# Patient Record
Sex: Male | Born: 1960 | Race: Black or African American | Hispanic: No | Marital: Single | Smoking: Never smoker
Health system: Southern US, Community
[De-identification: ages and names within clinical notes are randomized; demographics above are authoritative.]

## PROBLEM LIST (undated history)

## (undated) DIAGNOSIS — E119 Type 2 diabetes mellitus without complications: Secondary | ICD-10-CM

## (undated) DIAGNOSIS — F0390 Unspecified dementia without behavioral disturbance: Secondary | ICD-10-CM

---

## 2003-08-26 ENCOUNTER — Inpatient Hospital Stay (HOSPITAL_COMMUNITY): Admission: EM | Admit: 2003-08-26 | Discharge: 2003-08-31 | Payer: Self-pay | Admitting: Pulmonary Disease

## 2005-12-05 IMAGING — CR DG CHEST 1V PORT
1 series · 1 of 1 positions shown · non-contrast
Comparison: none

CLINICAL DATA: Respiratory failure. 
 PORTABLE CHEST ([DATE] HOURS)

[view not recorded]
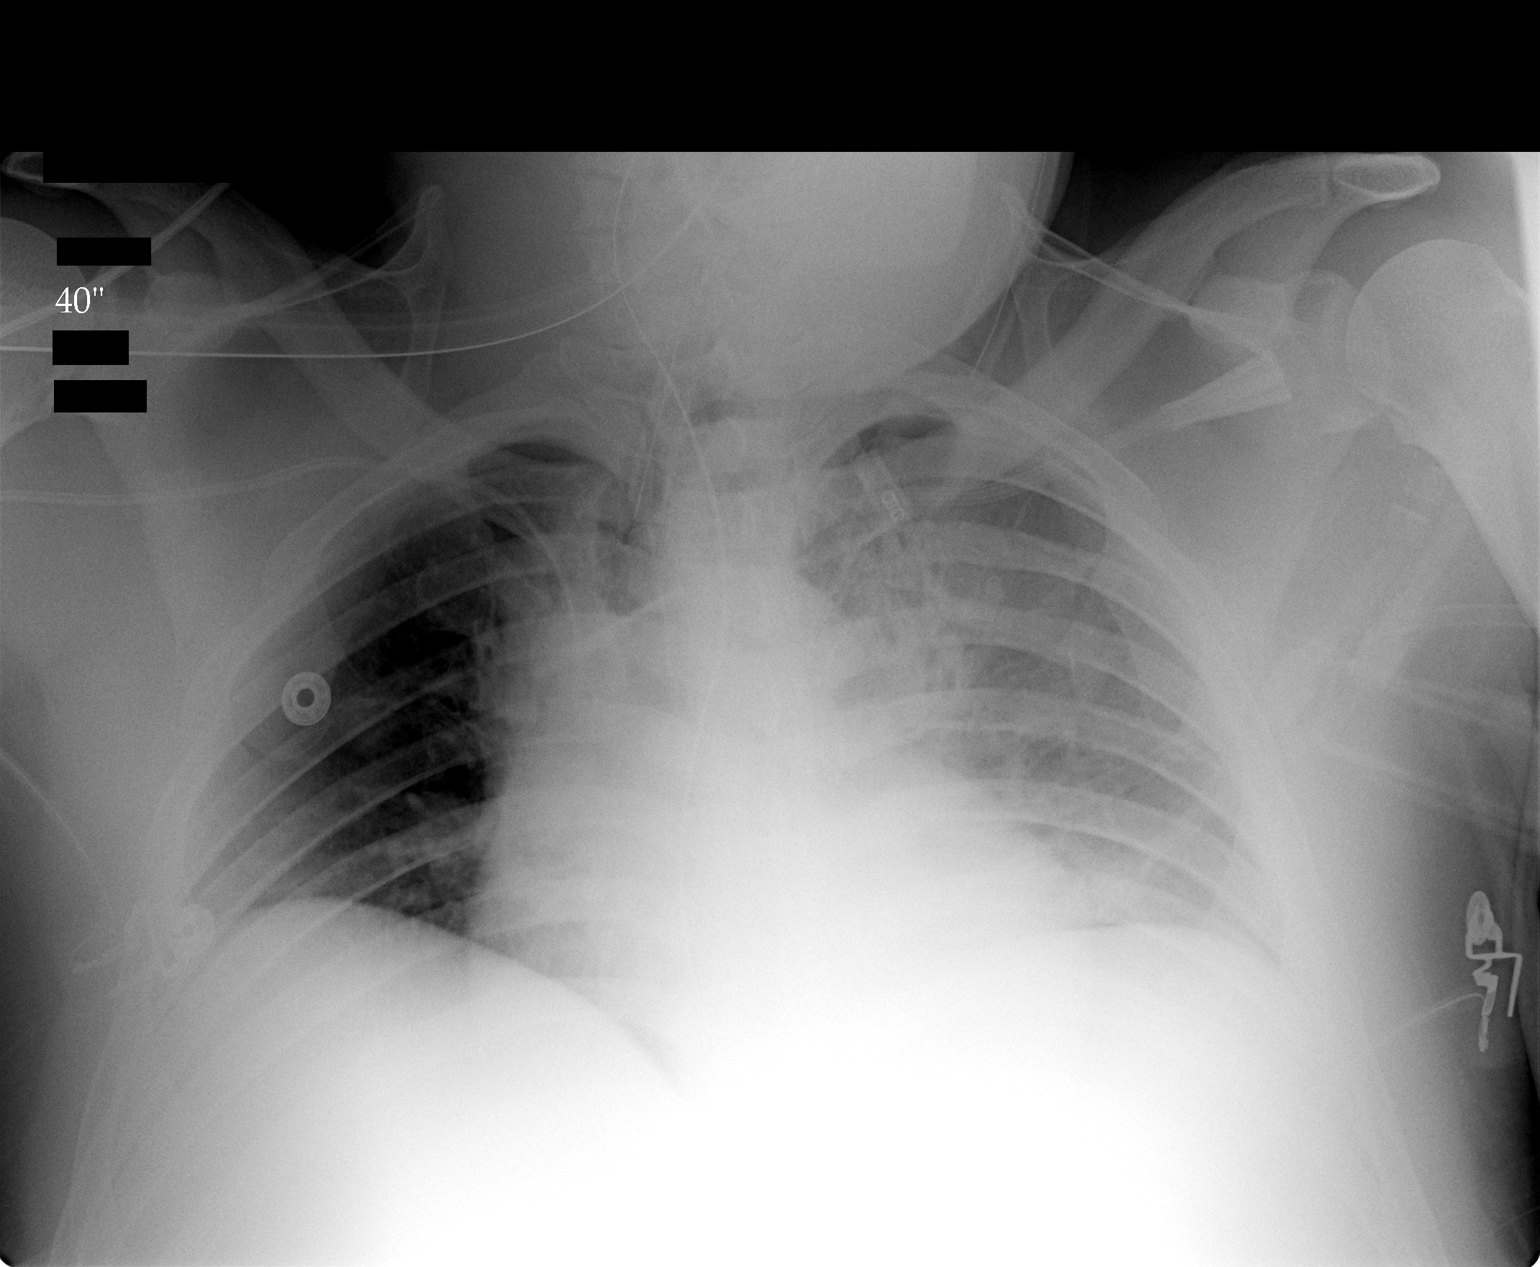

[1 of 1 positions shown; findings below may reference images not displayed]

FINDINGS: The endotracheal tube, bilateral central venous catheters and NG tube are stable. The lungs remain severely under inflated.  Atelectasis in the left upper lobe has worsened.  Atelectasis at the left base is stable.  The right lung is grossly clear.  No pneumothorax is seen.  The pulmonary vasculature is grossly within normal limits.
 IMPRESSION
 Worsening atelectasis in the left upper lobe.  Otherwise stable exam.

## 2011-10-09 DIAGNOSIS — E119 Type 2 diabetes mellitus without complications: Secondary | ICD-10-CM

## 2019-06-24 ENCOUNTER — Ambulatory Visit: Payer: Medicaid Other | Attending: Internal Medicine

## 2019-06-24 DIAGNOSIS — Z23 Encounter for immunization: Secondary | ICD-10-CM

## 2019-06-24 NOTE — Progress Notes (Signed)
   Covid-19 Vaccination Clinic  Name:  Jyron Turman    MRN: [id number] DOB: September 04, 1960  06/24/2019  Mr. Herbers was observed post Covid-19 immunization for 15 minutes without incident. He was provided with Vaccine Information Sheet and instruction to access the V-Safe system.   Mr. Quirino was instructed to call 911 with any severe reactions post vaccine: Marland Kitchen Difficulty breathing  . Swelling of face and throat  . A fast heartbeat  . A bad rash all over body  . Dizziness and weakness   Immunizations Administered    Name Date Dose VIS Date Route   Moderna COVID-19 Vaccine 06/24/2019  1:55 PM 0.5 mL 01/2019 Intramuscular   Manufacturer: Moderna   Lot: [id number]   NDC: 54360-677-03

## 2019-07-22 ENCOUNTER — Ambulatory Visit: Payer: Medicaid Other | Attending: Internal Medicine

## 2019-07-22 DIAGNOSIS — Z23 Encounter for immunization: Secondary | ICD-10-CM

## 2019-07-22 NOTE — Progress Notes (Signed)
   Covid-19 Vaccination Clinic  Name:  Curtis Tanner    MRN: [id number] DOB: 31-Dec-1960  07/22/2019  Curtis Tanner was observed post Covid-19 immunization for 15 minutes without incident. He was provided with Vaccine Information Sheet and instruction to access the V-Safe system.   Curtis Tanner was instructed to call 911 with any severe reactions post vaccine: Marland Kitchen Difficulty breathing  . Swelling of face and throat  . A fast heartbeat  . A bad rash all over body  . Dizziness and weakness   Immunizations Administered    Name Date Dose VIS Date Route   Moderna COVID-19 Vaccine 07/22/2019 10:06 AM 0.5 mL 01/2019 Intramuscular   Manufacturer: Moderna   Lot: [id number]   NDC: 02233-612-24

## 2020-12-30 ENCOUNTER — Emergency Department (HOSPITAL_COMMUNITY): Payer: Medicaid Other

## 2020-12-30 ENCOUNTER — Inpatient Hospital Stay (HOSPITAL_COMMUNITY)
Admission: EM | Admit: 2020-12-30 | Discharge: 2021-01-01 | DRG: 071 | Disposition: A | Discharge status: Home / Self Care | Payer: Medicaid Other | Attending: Internal Medicine | Admitting: Internal Medicine

## 2020-12-30 DIAGNOSIS — E119 Type 2 diabetes mellitus without complications: Secondary | ICD-10-CM

## 2020-12-30 DIAGNOSIS — E860 Dehydration: Secondary | ICD-10-CM | POA: Diagnosis present

## 2020-12-30 DIAGNOSIS — E876 Hypokalemia: Secondary | ICD-10-CM | POA: Diagnosis present

## 2020-12-30 DIAGNOSIS — I129 Hypertensive chronic kidney disease with stage 1 through stage 4 chronic kidney disease, or unspecified chronic kidney disease: Secondary | ICD-10-CM | POA: Diagnosis present

## 2020-12-30 DIAGNOSIS — G9340 Encephalopathy, unspecified: Principal | ICD-10-CM | POA: Diagnosis present

## 2020-12-30 DIAGNOSIS — Z888 Allergy status to other drugs, medicaments and biological substances status: Secondary | ICD-10-CM

## 2020-12-30 DIAGNOSIS — K219 Gastro-esophageal reflux disease without esophagitis: Secondary | ICD-10-CM

## 2020-12-30 DIAGNOSIS — I10 Essential (primary) hypertension: Secondary | ICD-10-CM

## 2020-12-30 DIAGNOSIS — N1831 Chronic kidney disease, stage 3a: Secondary | ICD-10-CM | POA: Diagnosis present

## 2020-12-30 DIAGNOSIS — R4182 Altered mental status, unspecified: Secondary | ICD-10-CM | POA: Diagnosis present

## 2020-12-30 DIAGNOSIS — E1122 Type 2 diabetes mellitus with diabetic chronic kidney disease: Secondary | ICD-10-CM | POA: Diagnosis present

## 2020-12-30 DIAGNOSIS — E1165 Type 2 diabetes mellitus with hyperglycemia: Secondary | ICD-10-CM | POA: Diagnosis present

## 2020-12-30 DIAGNOSIS — N179 Acute kidney failure, unspecified: Principal | ICD-10-CM

## 2020-12-30 DIAGNOSIS — Z91040 Latex allergy status: Secondary | ICD-10-CM

## 2020-12-30 DIAGNOSIS — Z20822 Contact with and (suspected) exposure to covid-19: Secondary | ICD-10-CM | POA: Diagnosis present

## 2020-12-30 DIAGNOSIS — A419 Sepsis, unspecified organism: Secondary | ICD-10-CM

## 2020-12-30 DIAGNOSIS — J45909 Unspecified asthma, uncomplicated: Secondary | ICD-10-CM | POA: Diagnosis present

## 2020-12-30 DIAGNOSIS — I959 Hypotension, unspecified: Secondary | ICD-10-CM | POA: Diagnosis present

## 2020-12-30 DIAGNOSIS — R001 Bradycardia, unspecified: Secondary | ICD-10-CM

## 2020-12-30 LAB — RAPID URINE DRUG SCREEN, HOSP PERFORMED
Amphetamines: NOT DETECTED
Barbiturates: NOT DETECTED
Benzodiazepines: NOT DETECTED
Cocaine: NOT DETECTED
Opiates: NOT DETECTED
Tetrahydrocannabinol: POSITIVE — AB

## 2020-12-30 LAB — CBC WITH DIFFERENTIAL/PLATELET
Abs Immature Granulocytes: 0.01 10*3/uL (ref 0.00–0.07)
Basophils Absolute: 0 10*3/uL (ref 0.0–0.1)
Basophils Relative: 1 %
Eosinophils Absolute: 0 10*3/uL (ref 0.0–0.5)
Eosinophils Relative: 1 %
HCT: 35.7 % — ABNORMAL LOW (ref 39.0–52.0)
Hemoglobin: 12.2 g/dL — ABNORMAL LOW (ref 13.0–17.0)
Immature Granulocytes: 0 %
Lymphocytes Relative: 32 %
Lymphs Abs: 1.3 10*3/uL (ref 0.7–4.0)
MCH: 29.7 pg (ref 26.0–34.0)
MCHC: 34.2 g/dL (ref 30.0–36.0)
MCV: 86.9 fL (ref 80.0–100.0)
Monocytes Absolute: 0.5 10*3/uL (ref 0.1–1.0)
Monocytes Relative: 11 %
Neutro Abs: 2.3 10*3/uL (ref 1.7–7.7)
Neutrophils Relative %: 55 %
Platelets: 164 10*3/uL (ref 150–400)
RBC: 4.11 MIL/uL — ABNORMAL LOW (ref 4.22–5.81)
RDW: 12.8 % (ref 11.5–15.5)
WBC: 4.1 10*3/uL (ref 4.0–10.5)
nRBC: 0 % (ref 0.0–0.2)

## 2020-12-30 LAB — I-STAT VENOUS BLOOD GAS, ED
Acid-base deficit: 1 mmol/L (ref 0.0–2.0)
Bicarbonate: 23.2 mmol/L (ref 20.0–28.0)
Calcium, Ion: 1.08 mmol/L — ABNORMAL LOW (ref 1.15–1.40)
HCT: 36 % — ABNORMAL LOW (ref 39.0–52.0)
Hemoglobin: 12.2 g/dL — ABNORMAL LOW (ref 13.0–17.0)
O2 Saturation: 85 %
Potassium: 3.8 mmol/L (ref 3.5–5.1)
Sodium: 136 mmol/L (ref 135–145)
TCO2: 24 mmol/L (ref 22–32)
pCO2, Ven: 37.5 mmHg — ABNORMAL LOW (ref 44.0–60.0)
pH, Ven: 7.4 (ref 7.250–7.430)
pO2, Ven: 49 mmHg — ABNORMAL HIGH (ref 32.0–45.0)

## 2020-12-30 LAB — AMMONIA: Ammonia: 26 umol/L (ref 9–35)

## 2020-12-30 LAB — MAGNESIUM: Magnesium: 1.8 mg/dL (ref 1.7–2.4)

## 2020-12-30 LAB — URINALYSIS, COMPLETE (UACMP) WITH MICROSCOPIC
Bilirubin Urine: NEGATIVE
Glucose, UA: >=500 mg/dL — AB
Hgb urine dipstick: NEGATIVE
Ketones, ur: NEGATIVE mg/dL
Leukocytes,Ua: NEGATIVE
Nitrite: NEGATIVE
Protein, ur: NEGATIVE mg/dL
Specific Gravity, Urine: 1.025 (ref 1.005–1.030)
pH: 5 (ref 5.0–8.0)

## 2020-12-30 LAB — RESP PANEL BY RT-PCR (FLU A&B, COVID) ARPGX2
Influenza A by PCR: NEGATIVE
Influenza B by PCR: NEGATIVE
SARS Coronavirus 2 by RT PCR: NEGATIVE

## 2020-12-30 LAB — ETHANOL: Alcohol, Ethyl (B): <10 mg/dL (ref <10)

## 2020-12-30 LAB — COMPREHENSIVE METABOLIC PANEL
ALT: 13 U/L (ref 0–44)
AST: 16 U/L (ref 15–41)
Albumin: 3.3 g/dL — ABNORMAL LOW (ref 3.5–5.0)
Alkaline Phosphatase: 54 U/L (ref 38–126)
Anion gap: 8 (ref 5–15)
BUN: 16 mg/dL (ref 6–20)
CO2: 22 mmol/L (ref 22–32)
Calcium: 8.6 mg/dL — ABNORMAL LOW (ref 8.9–10.3)
Chloride: 105 mmol/L (ref 98–111)
Creatinine, Ser: 2.55 mg/dL — ABNORMAL HIGH (ref 0.61–1.24)
GFR, Estimated: 28 mL/min — ABNORMAL LOW (ref >60)
Glucose, Bld: 329 mg/dL — ABNORMAL HIGH (ref 70–99)
Potassium: 3.9 mmol/L (ref 3.5–5.1)
Sodium: 135 mmol/L (ref 135–145)
Total Bilirubin: 0.4 mg/dL (ref 0.3–1.2)
Total Protein: 5.7 g/dL — ABNORMAL LOW (ref 6.5–8.1)

## 2020-12-30 LAB — TSH: TSH: 1.332 u[IU]/mL (ref 0.350–4.500)

## 2020-12-30 LAB — PROTIME-INR
INR: 1.1 (ref 0.8–1.2)
Prothrombin Time: 14.1 seconds (ref 11.4–15.2)

## 2020-12-30 LAB — TROPONIN I (HIGH SENSITIVITY): Troponin I (High Sensitivity): 7 ng/L (ref <18)

## 2020-12-30 LAB — LACTIC ACID, PLASMA: Lactic Acid, Venous: 2 mmol/L (ref 0.5–1.9)

## 2020-12-30 LAB — CBG MONITORING, ED: Glucose-Capillary: 301 mg/dL — ABNORMAL HIGH (ref 70–99)

## 2020-12-30 MED ORDER — SODIUM CHLORIDE 0.9 % IV BOLUS
1000.0000 mL | Freq: Once | INTRAVENOUS | Status: AC
  Administered 2020-12-30: 1000 mL via INTRAVENOUS

## 2020-12-30 NOTE — ED Triage Notes (Signed)
Pt from restaurant via [facility]. Per EMS, pt walked into restaurant, "not normal." On EMS arrival, pt unresponsive, slouched. CBG 130's, EKG showed acute MI, cancelled by EDP. BP 60's/20's -> 90/58 on arrival, HR 40's-70's. ETCO2 teens -> 30's, total of en route 18LAC  GCS 14 on arrival

## 2020-12-30 NOTE — ED Provider Notes (Signed)
[facility] EMERGENCY DEPARTMENT Provider Note   CSN: [id number] Arrival date & time: 12/30/20  2040     History Chief Complaint  Patient presents with   Hypotension    Curtis Tanner is a 60 y.o. male.  HPI  60 year old male with unknown PMH who presents via EMS with AMS.  Patient was reportedly found in a restaurant by bystanders to be acting abnormally.  On EMS arrival, patient was found to be hypotensive with systolic BPs in the 60s and bradycardic with rates in the 40s to 70s.  He was initially not responsive to pain, BGL was WNL.  He received 800 cc NS bolus prior to arrival. On arrival, the patient has no complaints, but is altered and unable to further provide history.  He does not appear in any acute distress.  Per review of records, patient's most recent ED visit was to [facility] in August for chronic abdominal pain.   No past medical history on file.  Patient Active Problem List   Diagnosis Date Noted   Acute encephalopathy 12/31/2020   Diabetes (HCC) 12/31/2020   HTN (hypertension) 12/31/2020   GERD (gastroesophageal reflux disease) 12/31/2020   AKI (acute kidney injury) (HCC) 12/31/2020   Bradycardia 12/31/2020    No family history on file.     Home Medications Prior to Admission medications   Not on File    Allergies    Ibuprofen, Latex, and Heparin (bovine)  Review of Systems   Review of Systems  Unable to perform ROS: Mental status change   Physical Exam Updated Vital Signs BP 109/72 (BP Location: Right Arm)   Pulse (!) 54   Temp (!) 97.2 F (36.2 C) (Oral)   Resp 16   SpO2 99%   Physical Exam Vitals and nursing note reviewed.  Constitutional:      General: He is not in acute distress.    Appearance: Normal appearance. He is well-developed and normal weight. He is not ill-appearing, toxic-appearing or diaphoretic.  HENT:     Head: Normocephalic and atraumatic.     Right Ear: External ear normal.     Left  Ear: External ear normal.     Nose: Nose normal.     Mouth/Throat:     Mouth: Mucous membranes are dry.     Pharynx: Oropharynx is clear.  Eyes:     General: No scleral icterus.    Extraocular Movements: Extraocular movements intact.     Conjunctiva/sclera: Conjunctivae normal.     Pupils: Pupils are equal, round, and reactive to light.  Cardiovascular:     Rate and Rhythm: Normal rate and regular rhythm.     Pulses: Normal pulses.     Heart sounds: Normal heart sounds. No murmur heard. Pulmonary:     Effort: Pulmonary effort is normal. No respiratory distress.     Breath sounds: Normal breath sounds.  Abdominal:     General: Abdomen is flat. There is no distension.     Palpations: Abdomen is soft.     Tenderness: There is no abdominal tenderness. There is no guarding or rebound.  Musculoskeletal:        General: Normal range of motion.     Cervical back: Normal range of motion and neck supple. No rigidity or tenderness.     Right lower leg: No edema.     Left lower leg: No edema.  Lymphadenopathy:     Cervical: No cervical adenopathy.  Skin:    General: Skin is  warm and dry.     Capillary Refill: Capillary refill takes less than 2 seconds.  Neurological:     Mental Status: He is alert. He is confused.     GCS: GCS eye subscore is 4. GCS verbal subscore is 4. GCS motor subscore is 6.     Cranial Nerves: Cranial nerves 2-12 are intact. No cranial nerve deficit, dysarthria or facial asymmetry.     Sensory: Sensation is intact.     Motor: Motor function is intact. No weakness, tremor, atrophy, abnormal muscle tone or seizure activity.     Coordination: Coordination is intact. Finger-Nose-Finger Test normal.     Comments: Alert and oriented x1.  Patient appears confused and does not answer questions appropriately.  No slurred speech or obvious focal neurologic deficit    ED Results / Procedures / Treatments   Labs (all labs ordered are listed, but only abnormal results are  displayed) Labs Reviewed  COMPREHENSIVE METABOLIC PANEL - Abnormal; Notable for the following components:      Result Value   Glucose, Bld 329 (*)    Creatinine, Ser 2.55 (*)    Calcium 8.6 (*)    Total Protein 5.7 (*)    Albumin 3.3 (*)    GFR, Estimated 28 (*)    All other components within normal limits  LACTIC ACID, PLASMA - Abnormal; Notable for the following components:   Lactic Acid, Venous 2.0 (*)    All other components within normal limits  CBC WITH DIFFERENTIAL/PLATELET - Abnormal; Notable for the following components:   RBC 4.11 (*)    Hemoglobin 12.2 (*)    HCT 35.7 (*)    All other components within normal limits  RAPID URINE DRUG SCREEN, HOSP PERFORMED - Abnormal; Notable for the following components:   Tetrahydrocannabinol POSITIVE (*)    All other components within normal limits  URINALYSIS, COMPLETE (UACMP) WITH MICROSCOPIC - Abnormal; Notable for the following components:   Glucose, UA >=500 (*)    Bacteria, UA RARE (*)    All other components within normal limits  CBG MONITORING, ED - Abnormal; Notable for the following components:   Glucose-Capillary 301 (*)    All other components within normal limits  I-STAT VENOUS BLOOD GAS, ED - Abnormal; Notable for the following components:   pCO2, Ven 37.5 (*)    pO2, Ven 49.0 (*)    Calcium, Ion 1.08 (*)    HCT 36.0 (*)    Hemoglobin 12.2 (*)    All other components within normal limits  RESP PANEL BY RT-PCR (FLU A&B, COVID) ARPGX2  CULTURE, BLOOD (ROUTINE X 2)  CULTURE, BLOOD (ROUTINE X 2)  URINE CULTURE  PROTIME-INR  TSH  AMMONIA  ETHANOL  MAGNESIUM  LACTIC ACID, PLASMA  CK  HIV ANTIBODY (ROUTINE TESTING W REFLEX)  RENAL FUNCTION PANEL  CBC  TROPONIN I (HIGH SENSITIVITY)    EKG EKG Interpretation  Date/Time:  Friday December 30 2020 20:45:47 EDT Ventricular Rate:  42 PR Interval:  177 QRS Duration: 87 QT Interval:  431 QTC Calculation: 361 R Axis:   -25 Text Interpretation: Sinus  bradycardia Borderline left axis deviation Low voltage, extremity leads Baseline wander in lead(s) II III aVF Since last tracing rate slower Confirmed by Jacalyn Lefevre [id number]) on 12/30/2020 9:02:45 PM  Radiology CT ABDOMEN PELVIS WO CONTRAST  Result Date: 12/30/2020 CLINICAL DATA:  Sepsis EXAM: CT ABDOMEN AND PELVIS WITHOUT CONTRAST TECHNIQUE: Multidetector CT imaging of the abdomen and pelvis was performed following the standard protocol  without IV contrast. COMPARISON:  08/27/2003 FINDINGS: Lower chest: No acute abnormality. Hepatobiliary: Gallbladder is not well appreciated likely decompressed. Pancreas: Pancreas demonstrates calcifications and metallic densities along the tail possibly related to prior embolotherapy. Spleen: Normal in size without focal abnormality. Adrenals/Urinary Tract: Adrenal glands are within normal limits. Nonobstructing renal calculi are noted in the lower poles bilaterally. The largest of these lies on the right measuring 7 mm. No ureteral stones are seen. The bladder is partially distended. Stomach/Bowel: No obstructive or inflammatory changes of the colon are seen. The appendix is within normal limits. Small bowel and stomach are unremarkable. Vascular/Lymphatic: IVC filter is noted in place. No significant lymphadenopathy is seen. Atherosclerotic calcifications of the aorta are noted. Reproductive: Prostate is unremarkable. Other: No abdominal wall hernia or abnormality. No abdominopelvic ascites. Musculoskeletal: No acute or significant osseous findings. 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 Nonobstructing renal calculi bilaterally. No other focal abnormality is noted. Electronically Signed   By: Alcide Clever M.D.   On: 12/30/2020 23:26   CT HEAD WO CONTRAST ( )  Result Date: 12/30/2020 CLINICAL DATA:  Altered mental status. EXAM: CT HEAD WITHOUT CONTRAST TECHNIQUE:  Contiguous axial images were obtained from the base of the skull through the vertex without intravenous contrast. COMPARISON:  None. FINDINGS: Brain: There is mild cerebral atrophy with widening of the extra-axial spaces and ventricular dilatation. There are areas of decreased attenuation within the white matter tracts of the supratentorial brain, consistent with microvascular disease changes. Vascular: No hyperdense vessel or unexpected calcification. Skull: Normal. Negative for fracture or focal lesion. Sinuses/Orbits: No acute finding. Other: None. IMPRESSION: 1. Generalized cerebral atrophy. 2. No acute intracranial abnormality. Electronically Signed   By: Aram Candela M.D.   On: 12/30/2020 21:46   DG Chest Port 1 View  Result Date: 12/30/2020 CLINICAL DATA:  Sepsis. EXAM: PORTABLE CHEST 1 VIEW COMPARISON:  08/31/2003 FINDINGS: The cardiomediastinal silhouette is within normal limits. The lungs are mildly hypoinflated with mild elevation of the right hemidiaphragm. Minimal bibasilar densities likely reflect atelectasis. The upper lungs are clear. No edema, sizable pleural effusion, or pneumothorax is identified. No acute osseous abnormality is seen. IMPRESSION: Minimal bibasilar atelectasis. Electronically Signed   By: Sebastian Ache M.D.   On: 12/30/2020 21:18    Procedures Procedures   Medications Ordered in ED Medications  sodium chloride flush (NS) 0.9 % injection 3 mL (3 mLs Intravenous Not Given 12/31/20 0108)  acetaminophen (TYLENOL) tablet 650 mg (has no administration in time range)    Or  acetaminophen (TYLENOL) suppository 650 mg (has no administration in time range)  0.9 %  sodium chloride infusion (has no administration in time range)  insulin glargine-yfgn (SEMGLEE) injection 10 Units (has no administration in time range)  insulin aspart (novoLOG) injection 0-15 Units (has no administration in time range)  insulin aspart (novoLOG) injection 0-5 Units (has no administration in  time range)  sodium chloride 0.9 % bolus 1,000 mL (0 mLs Intravenous Stopped 12/30/20 2203)  sodium chloride 0.9 % bolus 1,000 mL (0 mLs Intravenous Stopped 12/30/20 2257)    ED Course  I have reviewed the triage vital signs and the nursing notes.  Pertinent labs & imaging results that were available during my care of the patient were reviewed by me and considered in my medical decision making (see chart for details).    MDM Rules/Calculators/A&P  Curtis Tanner is a 60 y.o. male presenting with AMS. Initial VS significant for hypotension.   EKG interpretation: Sinus bradycardia, normal intervals, rate 42 bpm, no ST elevations or depressions, baseline wander inhibits full interpretation.  Labs: UDS positive for THC.  UA with glucosuria and rare bacteria, otherwise unremarkable.  VBG with pH of 7.4.  Mag WNL.  COVID/flu negative.  TSH and ammonia WNL.  EtOH undetectable.  Troponin of 7.  Blood cultures pending.  Lactic acidosis of 2.0, repeat pending.  Mild normocytic anemia, CBC otherwise unremarkable.  Creatinine of 2.55, increased from prior baseline with normal BUN.  Anion gap of 8.  CK pending.  Imaging: CXR, CT head, and CT AP unremarkable, noncontrasted studies. Imaging was reviewed by radiology and personally by me.  DDX considered: Substance intoxication or withdrawal, sepsis, bacteremia, head injury, CVA, meningitis, ACS, encephalopathy, metabolic derangement, acute blood loss anemia, rhabdomyolysis. History, examination, and objective data most consistent with AMS of unclear etiology, suspect hypotension secondary to hypovolemia, given fluid responsiveness.  Low suspicion for sepsis, but lactic acidosis necessitates further work-up.  No obvious source of infection identified.  No signs of trauma on exam.  No focal neurologic deficits.  UDS and exam inconsistent with specific toxidrome.  AKI likely secondary to hypovolemia.  Medications: Medications   sodium chloride flush (NS) 0.9 % injection 3 mL (3 mLs Intravenous Not Given 12/31/20 0108)  acetaminophen (TYLENOL) tablet 650 mg (has no administration in time range)    Or  acetaminophen (TYLENOL) suppository 650 mg (has no administration in time range)  0.9 %  sodium chloride infusion (has no administration in time range)  insulin glargine-yfgn (SEMGLEE) injection 10 Units (has no administration in time range)  insulin aspart (novoLOG) injection 0-15 Units (has no administration in time range)  insulin aspart (novoLOG) injection 0-5 Units (has no administration in time range)  sodium chloride 0.9 % bolus 1,000 mL (0 mLs Intravenous Stopped 12/30/20 2203)  sodium chloride 0.9 % bolus 1,000 mL (0 mLs Intravenous Stopped 12/30/20 2257)     Re-evaluated after interventions.  Patient now alert and oriented x3, states he was "feeling tired" today but denies any other events or symptoms.  Patient now normotensive, remains bradycardic but improving.  Normothermic.  Admitted to hospitalist in stable condition.  Oncoming team to follow-up on pending work-up.  MIVF ordered. Patient understands and agrees with the plan.    The plan for this patient was discussed with my attending physician, who voiced agreement and who oversaw evaluation and treatment of this patient.     Note: Chief Executive Officer was used in the creation of this note.  Final Clinical Impression(s) / ED Diagnoses Final diagnoses:  AKI (acute kidney injury) [facility])    Rx / DC Orders ED Discharge Orders     None        Dwaine Gale, DO 12/31/20 0126    Jacalyn Lefevre, MD 12/31/20 1658

## 2020-12-31 ENCOUNTER — Inpatient Hospital Stay (HOSPITAL_COMMUNITY): Payer: Medicaid Other

## 2020-12-31 DIAGNOSIS — R001 Bradycardia, unspecified: Secondary | ICD-10-CM

## 2020-12-31 DIAGNOSIS — I129 Hypertensive chronic kidney disease with stage 1 through stage 4 chronic kidney disease, or unspecified chronic kidney disease: Secondary | ICD-10-CM | POA: Diagnosis present

## 2020-12-31 DIAGNOSIS — I10 Essential (primary) hypertension: Secondary | ICD-10-CM

## 2020-12-31 DIAGNOSIS — E119 Type 2 diabetes mellitus without complications: Secondary | ICD-10-CM

## 2020-12-31 DIAGNOSIS — J45909 Unspecified asthma, uncomplicated: Secondary | ICD-10-CM | POA: Diagnosis present

## 2020-12-31 DIAGNOSIS — E876 Hypokalemia: Secondary | ICD-10-CM | POA: Diagnosis present

## 2020-12-31 DIAGNOSIS — Z794 Long term (current) use of insulin: Secondary | ICD-10-CM

## 2020-12-31 DIAGNOSIS — I959 Hypotension, unspecified: Secondary | ICD-10-CM | POA: Diagnosis present

## 2020-12-31 DIAGNOSIS — N1831 Chronic kidney disease, stage 3a: Secondary | ICD-10-CM | POA: Diagnosis present

## 2020-12-31 DIAGNOSIS — R4182 Altered mental status, unspecified: Secondary | ICD-10-CM | POA: Diagnosis present

## 2020-12-31 DIAGNOSIS — K219 Gastro-esophageal reflux disease without esophagitis: Secondary | ICD-10-CM | POA: Diagnosis present

## 2020-12-31 DIAGNOSIS — N179 Acute kidney failure, unspecified: Secondary | ICD-10-CM | POA: Diagnosis present

## 2020-12-31 DIAGNOSIS — R55 Syncope and collapse: Secondary | ICD-10-CM

## 2020-12-31 DIAGNOSIS — E1165 Type 2 diabetes mellitus with hyperglycemia: Secondary | ICD-10-CM | POA: Diagnosis present

## 2020-12-31 DIAGNOSIS — G9340 Encephalopathy, unspecified: Principal | ICD-10-CM

## 2020-12-31 DIAGNOSIS — R404 Transient alteration of awareness: Secondary | ICD-10-CM

## 2020-12-31 DIAGNOSIS — E1122 Type 2 diabetes mellitus with diabetic chronic kidney disease: Secondary | ICD-10-CM | POA: Diagnosis present

## 2020-12-31 DIAGNOSIS — Z888 Allergy status to other drugs, medicaments and biological substances status: Secondary | ICD-10-CM | POA: Diagnosis not present

## 2020-12-31 DIAGNOSIS — Z20822 Contact with and (suspected) exposure to covid-19: Secondary | ICD-10-CM | POA: Diagnosis present

## 2020-12-31 DIAGNOSIS — E860 Dehydration: Secondary | ICD-10-CM | POA: Diagnosis present

## 2020-12-31 DIAGNOSIS — Z91040 Latex allergy status: Secondary | ICD-10-CM | POA: Diagnosis not present

## 2020-12-31 LAB — RENAL FUNCTION PANEL
Albumin: 3 g/dL — ABNORMAL LOW (ref 3.5–5.0)
Anion gap: 6 (ref 5–15)
BUN: 14 mg/dL (ref 6–20)
CO2: 23 mmol/L (ref 22–32)
Calcium: 8.3 mg/dL — ABNORMAL LOW (ref 8.9–10.3)
Chloride: 107 mmol/L (ref 98–111)
Creatinine, Ser: 1.94 mg/dL — ABNORMAL HIGH (ref 0.61–1.24)
GFR, Estimated: 39 mL/min — ABNORMAL LOW (ref >60)
Glucose, Bld: 302 mg/dL — ABNORMAL HIGH (ref 70–99)
Phosphorus: 3.4 mg/dL (ref 2.5–4.6)
Potassium: 4 mmol/L (ref 3.5–5.1)
Sodium: 136 mmol/L (ref 135–145)

## 2020-12-31 LAB — ECHOCARDIOGRAM COMPLETE
AR max vel: 2.64 cm2
AV Area VTI: 2.3 cm2
AV Area mean vel: 2.48 cm2
AV Mean grad: 5 mmHg
AV Peak grad: 10.4 mmHg
Ao pk vel: 1.61 m/s
Area-P 1/2: 4.06 cm2
Height: 65 in
S' Lateral: 2.6 cm
Weight: 2848 oz

## 2020-12-31 LAB — BLOOD CULTURE ID PANEL (REFLEXED) - BCID2

## 2020-12-31 LAB — MRSA NEXT GEN BY PCR, NASAL: MRSA by PCR Next Gen: NOT DETECTED

## 2020-12-31 LAB — CK: Total CK: 134 U/L (ref 49–397)

## 2020-12-31 LAB — GLUCOSE, CAPILLARY
Glucose-Capillary: 332 mg/dL — ABNORMAL HIGH (ref 70–99)
Glucose-Capillary: 114 mg/dL — ABNORMAL HIGH (ref 70–99)
Glucose-Capillary: 176 mg/dL — ABNORMAL HIGH (ref 70–99)

## 2020-12-31 LAB — CBC
HCT: 34.4 % — ABNORMAL LOW (ref 39.0–52.0)
Hemoglobin: 11.9 g/dL — ABNORMAL LOW (ref 13.0–17.0)
MCH: 30.2 pg (ref 26.0–34.0)
MCHC: 34.6 g/dL (ref 30.0–36.0)
MCV: 87.3 fL (ref 80.0–100.0)
Platelets: 133 10*3/uL — ABNORMAL LOW (ref 150–400)
RBC: 3.94 MIL/uL — ABNORMAL LOW (ref 4.22–5.81)
RDW: 12.7 % (ref 11.5–15.5)
WBC: 4.4 10*3/uL (ref 4.0–10.5)
nRBC: 0 % (ref 0.0–0.2)

## 2020-12-31 LAB — CBG MONITORING, ED
Glucose-Capillary: 268 mg/dL — ABNORMAL HIGH (ref 70–99)
Glucose-Capillary: 112 mg/dL — ABNORMAL HIGH (ref 70–99)

## 2020-12-31 LAB — LACTIC ACID, PLASMA: Lactic Acid, Venous: 1.3 mmol/L (ref 0.5–1.9)

## 2020-12-31 LAB — HIV ANTIBODY (ROUTINE TESTING W REFLEX): HIV Screen 4th Generation wRfx: NONREACTIVE

## 2020-12-31 MED ORDER — INSULIN GLARGINE-YFGN 100 UNIT/ML ~~LOC~~ SOLN
10.0000 [IU] | Freq: Every day | SUBCUTANEOUS | Status: DC
  Administered 2020-12-31: 10 [IU] via SUBCUTANEOUS
  Filled 2020-12-31 (×3): qty 0.1

## 2020-12-31 MED ORDER — ACETAMINOPHEN 650 MG RE SUPP: 650.0000 mg | Freq: Four times a day (QID) | RECTAL | Status: DC | PRN

## 2020-12-31 MED ORDER — INSULIN GLARGINE-YFGN 100 UNIT/ML ~~LOC~~ SOLN
20.0000 [IU] | Freq: Every day | SUBCUTANEOUS | Status: DC
  Administered 2020-12-31: 20 [IU] via SUBCUTANEOUS
  Filled 2020-12-31 (×2): qty 0.2

## 2020-12-31 MED ORDER — SODIUM CHLORIDE 0.9% FLUSH
3.0000 mL | Freq: Two times a day (BID) | INTRAVENOUS | Status: DC
  Administered 2020-12-31 – 2021-01-01 (×3): 3 mL via INTRAVENOUS

## 2020-12-31 MED ORDER — ADULT MULTIVITAMIN W/MINERALS CH
1.0000 | ORAL_TABLET | Freq: Every day | ORAL | Status: DC
  Administered 2020-12-31 – 2021-01-01 (×2): 1 via ORAL
  Filled 2020-12-31 (×2): qty 1

## 2020-12-31 MED ORDER — TAB-A-VITE/IRON PO TABS
1.0000 | ORAL_TABLET | Freq: Every day | ORAL | Status: DC
  Filled 2020-12-31: qty 1

## 2020-12-31 MED ORDER — ACETAMINOPHEN 325 MG PO TABS: 650.0000 mg | ORAL_TABLET | Freq: Four times a day (QID) | ORAL | Status: DC | PRN

## 2020-12-31 MED ORDER — VANCOMYCIN HCL 1750 MG/350ML IV SOLN
1750.0000 mg | Freq: Once | INTRAVENOUS | Status: AC
  Administered 2020-12-31: 1750 mg via INTRAVENOUS
  Filled 2020-12-31: qty 350

## 2020-12-31 MED ORDER — VANCOMYCIN HCL 1000 MG/200ML IV SOLN: 1000.0000 mg | INTRAVENOUS | Status: DC

## 2020-12-31 MED ORDER — SODIUM CHLORIDE 0.9 % IV SOLN: Freq: Once | INTRAVENOUS | Status: DC

## 2020-12-31 MED ORDER — INSULIN ASPART 100 UNIT/ML IJ SOLN
0.0000 [IU] | Freq: Three times a day (TID) | INTRAMUSCULAR | Status: DC
  Administered 2020-12-31: 11 [IU] via SUBCUTANEOUS
  Administered 2021-01-01: 5 [IU] via SUBCUTANEOUS

## 2020-12-31 MED ORDER — INSULIN ASPART 100 UNIT/ML IJ SOLN
0.0000 [IU] | Freq: Every day | INTRAMUSCULAR | Status: DC
  Administered 2020-12-31: 3 [IU] via SUBCUTANEOUS

## 2020-12-31 MED ORDER — LORAZEPAM 1 MG PO TABS: 1.0000 mg | ORAL_TABLET | ORAL | Status: DC | PRN

## 2020-12-31 MED ORDER — THIAMINE HCL 100 MG PO TABS
250.0000 mg | ORAL_TABLET | Freq: Two times a day (BID) | ORAL | Status: DC
  Administered 2020-12-31 – 2021-01-01 (×2): 250 mg via ORAL
  Filled 2020-12-31 (×2): qty 3

## 2020-12-31 MED ORDER — SODIUM CHLORIDE 0.9 % IV SOLN: INTRAVENOUS | Status: DC

## 2020-12-31 NOTE — H&P (Signed)
History and Physical   Curtis Tanner FYB:[id number] DOB: 1960-10-29 DOA: 12/30/2020  PCP: Toma Deiters, MD  Patient coming from: Restaurant, via EMS  Chief Complaint: Altered mental status  HPI: Curtis Tanner is a 60 y.o. male with medical history significant of asthma and diabetes per chart review who presents with episode of altered mental status.  History obtained with assistance of chart review due to patient's unclear memory of what happened.  Patient was reportedly acting abnormal when he arrived at a restaurant (he visit this restaurant frequently in the folks there could tell he was acting unusual.).  EMS was called and on arrival patient was found to be unresponsive and slouched over EKG showing possible MI this was cleared by EDP.  Blood pressure was hypotensive initially in the 60s improved to the 90s systolic.  Heart rate ranging from 40s to 70s.  He reports he has been feeling okay, denies feeling unusual earlier today nor decreased PO intake. Reports some marijuana use.  He denies any fevers, chills, chest pain, shortness of breath, abdominal pain, constipation, diarrhea, nausea, vomiting.   ED Course: Vital signs in the ED significant for blood pressure initial in the 80s improved to the 100 systolic.  Temperature 96.8-97.2.  Heart rate in the 40s to 50s.  Lab work-up showed CMP with creatinine elevated 2.55 from a baseline of around 1.46.  Glucose 329.  Calcium 8.6, albumin 3.3, protein 5.7.  CBC with hemoglobin of 12.2 which is stable for him.  PT and INR within normal limits.  Troponin normal with repeat pending.  CK pending.  Lactic acid 2 with repeat pending.  Respiratory panel for flu and COVID-negative.  Urinalysis without evidence of infection.  Urine culture and blood cultures pending.  UDS with THC positive only.  Ammonia normal, TSH normal, ethanol level negative.  VBG with pH normal at 7.4 and PCO2 37.5.  Chest x-ray showed minimal atelectasis but  otherwise no acute abnormality.  CT head showed no acute abnormality.  CT of the abdomen and pelvis showed chronic pancreatic attic changes associated with procedures but no acute normalities.  Magnesium level was normal.  Patient received 2 L of IV fluids in the ED and started on a rate of IV fluids.  Review of Systems: As per HPI otherwise all other systems reviewed and are negative.  No past medical history on file.  Social History  has no history on file for tobacco use, alcohol use, and drug use.  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  Prior to Admission medications   Not on File  Chlorthalidone 25 mg daily Duloxetine 30 mg EC omeprazole 40 mg Insulin, last dose listed was 60 units daily Hyoscyamine as needed  Physical Exam: Vitals:   12/30/20 2145 12/30/20 2215 12/30/20 2229 12/31/20 0013  BP: (!) 87/57 100/75  109/72  Pulse: (!) 46 (!) 53  (!) 54  Resp: 17 13  16   Temp:   (!) 96.8 F (36 C) (!) 97.2 F (36.2 C)  TempSrc:    Oral  SpO2:  98%  99%   Physical Exam Constitutional:      General: He is not in acute distress.    Appearance: Normal appearance.  HENT:     Head: Normocephalic and atraumatic.     Mouth/Throat:     Mouth: Mucous membranes  are moist.     Pharynx: Oropharynx is clear.  Eyes:     Extraocular Movements: Extraocular movements intact.     Pupils: Pupils are equal, round, and reactive to light.  Cardiovascular:     Rate and Rhythm: Regular rhythm. Bradycardia present.     Pulses: Normal pulses.     Heart sounds: Normal heart sounds.  Pulmonary:     Effort: Pulmonary effort is normal. No respiratory distress.     Breath sounds: Normal breath sounds.  Abdominal:     General: Bowel sounds are normal. There is no distension.     Palpations: Abdomen is soft.     Tenderness: There is no abdominal tenderness.   Musculoskeletal:        General: No swelling or deformity.  Skin:    General: Skin is warm and dry.  Neurological:     General: No focal deficit present.     Mental Status: He is oriented to person, place, and time.   Labs on Admission: I have personally reviewed following labs and imaging studies  CBC: Recent Labs  Lab 12/30/20 2046 12/30/20 2117  WBC 4.1  --   NEUTROABS 2.3  --   HGB 12.2* 12.2*  HCT 35.7* 36.0*  MCV 86.9  --   PLT 164  --     Basic Metabolic Panel: Recent Labs  Lab 12/30/20 2046 12/30/20 2100 12/30/20 2117  NA 135  --  136  K 3.9  --  3.8  CL 105  --   --   CO2 22  --   --   GLUCOSE 329*  --   --   BUN 16  --   --   CREATININE 2.55*  --   --   CALCIUM 8.6*  --   --   MG  --  1.8  --     GFR: CrCl cannot be calculated (Unknown ideal weight.).  Liver Function Tests: Recent Labs  Lab 12/30/20 2046  AST 16  ALT 13  ALKPHOS 54  BILITOT 0.4  PROT 5.7*  ALBUMIN 3.3*    Urine analysis:    Component Value Date/Time   COLORURINE YELLOW 12/30/2020 2241   APPEARANCEUR CLEAR 12/30/2020 2241   LABSPEC 1.025 12/30/2020 2241   PHURINE 5.0 12/30/2020 2241   GLUCOSEU >=500 (A) 12/30/2020 2241   HGBUR NEGATIVE 12/30/2020 2241   BILIRUBINUR NEGATIVE 12/30/2020 2241   KETONESUR NEGATIVE 12/30/2020 2241   PROTEINUR NEGATIVE 12/30/2020 2241   NITRITE NEGATIVE 12/30/2020 2241   LEUKOCYTESUR NEGATIVE 12/30/2020 2241    Radiological Exams on Admission: CT ABDOMEN PELVIS WO CONTRAST  Result Date: 12/30/2020 CLINICAL DATA:  Sepsis EXAM: CT ABDOMEN AND PELVIS WITHOUT CONTRAST TECHNIQUE: Multidetector CT imaging of the abdomen and pelvis was performed following the standard protocol without IV contrast. COMPARISON:  08/27/2003 FINDINGS: Lower chest: No acute abnormality. Hepatobiliary: Gallbladder is not well appreciated likely decompressed. Pancreas: Pancreas demonstrates calcifications and metallic densities along the tail possibly related to prior  embolotherapy. Spleen: Normal in size without focal abnormality. Adrenals/Urinary Tract: Adrenal glands are within normal limits. Nonobstructing renal calculi are noted in the lower poles bilaterally. The largest of these lies on the right measuring 7 mm. No ureteral stones are seen. The bladder is partially distended. Stomach/Bowel: No obstructive or inflammatory changes of the colon are seen. The appendix is within normal limits. Small bowel and stomach are unremarkable. Vascular/Lymphatic: IVC filter is noted in place. No significant lymphadenopathy is seen. Atherosclerotic calcifications of the aorta  are noted. Reproductive: Prostate is unremarkable. Other: No abdominal wall hernia or abnormality. No abdominopelvic ascites. Musculoskeletal: No acute or significant osseous findings. 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 Nonobstructing renal calculi bilaterally. No other focal abnormality is noted. Electronically Signed   By: Alcide Clever M.D.   On: 12/30/2020 23:26   CT HEAD WO CONTRAST ( )  Result Date: 12/30/2020 CLINICAL DATA:  Altered mental status. EXAM: CT HEAD WITHOUT CONTRAST TECHNIQUE: Contiguous axial images were obtained from the base of the skull through the vertex without intravenous contrast. COMPARISON:  None. FINDINGS: Brain: There is mild cerebral atrophy with widening of the extra-axial spaces and ventricular dilatation. There are areas of decreased attenuation within the white matter tracts of the supratentorial brain, consistent with microvascular disease changes. Vascular: No hyperdense vessel or unexpected calcification. Skull: Normal. Negative for fracture or focal lesion. Sinuses/Orbits: No acute finding. Other: None. IMPRESSION: 1. Generalized cerebral atrophy. 2. No acute intracranial abnormality. Electronically Signed   By: Aram Candela M.D.   On: 12/30/2020 21:46   DG  Chest Port 1 View  Result Date: 12/30/2020 CLINICAL DATA:  Sepsis. EXAM: PORTABLE CHEST 1 VIEW COMPARISON:  08/31/2003 FINDINGS: The cardiomediastinal silhouette is within normal limits. The lungs are mildly hypoinflated with mild elevation of the right hemidiaphragm. Minimal bibasilar densities likely reflect atelectasis. The upper lungs are clear. No edema, sizable pleural effusion, or pneumothorax is identified. No acute osseous abnormality is seen. IMPRESSION: Minimal bibasilar atelectasis. Electronically Signed   By: Sebastian Ache M.D.   On: 12/30/2020 21:18    EKG: Independently reviewed. Sinus bradycardia at 42 bpm.  Some baseline wander.  Low voltage.  Assessment/Plan Principal Problem:   Acute encephalopathy Active Problems:   Diabetes (HCC)   HTN (hypertension)   GERD (gastroesophageal reflux disease)   AKI (acute kidney injury) (HCC)   Bradycardia  Acute encephalopathy > Unclear etiology at this time.  He has noted to have AKI but does not appear to be prerenal as his BUN is normal. > No evidence of infection, no leukocytosis, no evidence of infection on chest x-ray, CT abdomen pelvis, urinalysis. > Electrolytes within normal limits including potassium, magnesium, calcium mildly abnormal at 8.6. > TSH within normal limits, ethanol level negative, ammonia level negative, VBG with pH 7.4 and PCO2 37.5. > UDS is positive for THC only. > CT head negative for acute abnormality > Patient spontaneously improved in the ED with IV hydration. - Monitor on telemetry  - Continue to rehydrate with IV fluids - Trend renal function and electrolytes - Monitor response   AKI > Significant AKI with creatinine elevated 2.55 with Lasix creatinine chart review showed to be 1.46. > BUN currently normal and clear if this relates to the rate of renal injury versus possible intrinsic injury. > We will monitor response to IV fluids at first.  May need evaluation by nephrology if if not improving and  suspicion is for intrinsic disease. - Holding home PPI in case of AIN - Holding home chlorthalidone as well. - Trend renal function and electrolytes  Bradycardia > Currently with sinus bradycardia in the 40s to 50s in the ED.  Is asymptomatic.  Troponin normal. > Chart review shows resting heart rate in the 50s at multiple ED visits in the past year. - Continue to monitor.   Diabetes > Patient unsure of his insulin dose, chart review show 60 units daily. - We will  start with 10 units daily and SSI   Hypertension > Has been hypotensive at times here. - Holding home chlorthalidone`  GERD - Holding home PPI in the setting of AKI  DVT prophylaxis: SCDs Code Status:   Full  Family Communication:  None on admission. Patient stats he does not need anyone contacted.  Disposition Plan:   Patient is from:  Home  Anticipated DC to:  Pending clinical course  Anticipated DC date:  Pending clinical course  Anticipated DC barriers: None  Consults called:  None, if not improving may need to speak with nephrology. Admission status:  Observation, telemetry   Severity of Illness: The appropriate patient status for this patient is OBSERVATION. Observation status is judged to be reasonable and necessary in order to provide the required intensity of service to ensure the patient's safety. The patient's presenting symptoms, physical exam findings, and initial radiographic and laboratory data in the context of their medical condition is felt to place them at decreased risk for further clinical deterioration. Furthermore, it is anticipated that the patient will be medically stable for discharge from the hospital within 2 midnights of admission.    Synetta Fail MD [facility]  How to contact the [facility] Attending or Consulting provider 7A - 7P or covering provider during after hours 7P -7A, for this patient?   Check the care team in [facility] and look for a) attending/consulting [facility] provider listed  and b) the [facility] team listed Log into [email or web address] and use [place]'s universal password to access. If you do not have the password, please contact the hospital operator. Locate the [facility] provider you are looking for under [facility] and page to a number that you can be directly reached. If you still have difficulty reaching the provider, please page the [facility] (Director on Call) for the Hospitalists listed on amion for assistance.  12/31/2020, 12:50 AM

## 2020-12-31 NOTE — Progress Notes (Signed)
Pt is trying to get out of bed and asking to leave since he arrived this morning to the unit. His sister called him to bed side but pt is convinced that she is coming to pick him up. He is trying to get up and walk away, Paged Dr Ella Jubilee and informed the situation and possible sitter.  Will continue to monitor the patient  Lonia Farber, RN

## 2020-12-31 NOTE — ED Notes (Signed)
Pt resting on stretcher with eyes closed, respirations even and unlabored. No acute changes noted. Will continue to monitor. Lights off, side rails up x2, call bell within reach, urinal at bedside.

## 2020-12-31 NOTE — Progress Notes (Signed)
  Echocardiogram 2D Echocardiogram has been performed.  Curtis Tanner F 12/31/2020, 5:41 PM

## 2020-12-31 NOTE — Plan of Care (Signed)

## 2020-12-31 NOTE — Progress Notes (Signed)
PHARMACY - PHYSICIAN COMMUNICATION CRITICAL VALUE ALERT - BLOOD CULTURE IDENTIFICATION (BCID)  Curtis Tanner is an 60 y.o. male who presented to [facility] on 12/30/2020 with a chief complaint of AMS  Assessment:  Blood cultures show GPC in 2/2 botttles and BCID shows staph epi with MEC resistance detected. WBC= 4.4  Name of physician (or Provider) Contacted: Myra Gianotti  Current antibiotics: None  Changes to prescribed antibiotics recommended: Begin Vancomycin Recommendations accepted by provider  Results for orders placed or performed during the hospital encounter of 12/30/20  Blood Culture ID Panel (Reflexed) (Collected: 12/31/2020  2:30 AM)  Result Value Ref Range   Enterococcus faecalis NOT DETECTED NOT DETECTED   Enterococcus Faecium NOT DETECTED NOT DETECTED   Listeria monocytogenes NOT DETECTED NOT DETECTED   Staphylococcus species DETECTED (A) NOT DETECTED   Staphylococcus aureus (BCID) NOT DETECTED NOT DETECTED   Staphylococcus epidermidis DETECTED (A) NOT DETECTED   Staphylococcus lugdunensis NOT DETECTED NOT DETECTED   Streptococcus species NOT DETECTED NOT DETECTED   Streptococcus agalactiae NOT DETECTED NOT DETECTED   Streptococcus pneumoniae NOT DETECTED NOT DETECTED   Streptococcus pyogenes NOT DETECTED NOT DETECTED   A.calcoaceticus-baumannii NOT DETECTED NOT DETECTED   Bacteroides fragilis NOT DETECTED NOT DETECTED   Enterobacterales NOT DETECTED NOT DETECTED   Enterobacter cloacae complex NOT DETECTED NOT DETECTED   Escherichia coli NOT DETECTED NOT DETECTED   Klebsiella aerogenes NOT DETECTED NOT DETECTED   Klebsiella oxytoca NOT DETECTED NOT DETECTED   Klebsiella pneumoniae NOT DETECTED NOT DETECTED   Proteus species NOT DETECTED NOT DETECTED   Salmonella species NOT DETECTED NOT DETECTED   Serratia marcescens NOT DETECTED NOT DETECTED   Haemophilus influenzae NOT DETECTED NOT DETECTED   Neisseria meningitidis NOT DETECTED NOT DETECTED    Pseudomonas aeruginosa NOT DETECTED NOT DETECTED   Stenotrophomonas maltophilia NOT DETECTED NOT DETECTED   Candida albicans NOT DETECTED NOT DETECTED   Candida auris NOT DETECTED NOT DETECTED   Candida glabrata NOT DETECTED NOT DETECTED   Candida krusei NOT DETECTED NOT DETECTED   Candida parapsilosis NOT DETECTED NOT DETECTED   Candida tropicalis NOT DETECTED NOT DETECTED   Cryptococcus neoformans/gattii NOT DETECTED NOT DETECTED   Methicillin resistance mecA/C DETECTED (A) NOT DETECTED    Harland German, PharmD Clinical Pharmacist **Pharmacist phone directory can now be found on [email or web address] (PW [email or web address]).  Listed under [facility].

## 2020-12-31 NOTE — Progress Notes (Signed)
PROGRESS NOTE    Curtis Tanner  ENI:[id number] DOB: 22-Mar-1960 DOA: 12/30/2020 PCP: Toma Deiters, MD    Brief Narrative:  Mr. Kucher was admitted to the hospital with the working diagnosis of altered mental status.   60 yo male with the past medical history for asthma and type 2 diabetes mellitus who presented with altered mental status.  Patient was unable to give detailed history due to cognitive impairment.  Apparently he was acting unusual at the [employer], EMS was called and patient was found unresponsive.  His blood pressure was 60 and his heart rate 40s to 70s.  He was transported to the hospital for further evaluation.  On arrival to the ED patient had recovered his consciousness, reported feeling okay.  His initial blood pressure was 80 systolic, improved to 100, temperature 96.8, heart rate 40- 50, his lungs are clear to auscultation bilaterally, heart S1-S2, present, bradycardic, abdomen soft nontender, no lower extremity edema.  Sodium 135, potassium 3.9, chloride 105, bicarb 22, glucose 329, BUN 16, creatinine 2.55, AST 16, ALT 13, CK1 34, lactic acid 2.0, white count 4.1, hemoglobin 12.2, hematocrit 35.7, platelets 164. SARS COVID-19 negative.  Urinalysis specific gravity 1.025.  Toxicology screen alcohol less than 10, positive tetrahydrocannabinol.  Head CT with generalized cerebral atrophy.  No acute changes.  Chest radiograph with no infiltrates.  EKG 42 bpm, left axis deviation, normal intervals, sinus rhythm, no significant ST segment or T wave changes.  Assessment & Plan:   Principal Problem:   Acute encephalopathy Active Problems:   Diabetes (HCC)   HTN (hypertension)   GERD (gastroesophageal reflux disease)   AKI (acute kidney injury) (HCC)   Bradycardia   Altered mental status. Patient has improved but not back to baseline, continue to have episodic confusion.  He is eating and drinking well.   Plan to continue close neuro checks Add  thiamine and multivitamins.  Positive tetrahydrocannabinol on admission  Will add lorazepam as needed for anxiety.   2. Bradycardia, syncope. Continue telemetry monitoring, will discontinue IV fluids. Further work up with echocardiogram  His EKG has no Qtc prolongation and no Brugada features.   3. AKI ON CKD stage 3a. Care everywhere checked, baseline cr is from 1,5 to 2,0 Today is renal function is showing serum cr at 1,94, with K at 4,0 and serum bicarbonate at 23.   4. T2DM/ with hyperglycemia Fasting glucose 302, capillary 112 and 332.  Continue glucose cover and monitoring with insulin sliding scale. Increase basal insulin to 10 units.    5. HTN. Continue close blood pressure monitoring, continue to hold on antihypertensive medications.   6. GERD. Continue antiacid therapy   Patient continue to be at high risk for worsening encephalopathy   Status is: Observation  The patient will require care spanning > 2 midnights and should be moved to inpatient because: neuro checks and telemetry monitoring   DVT prophylaxis:  Enoxaparin   Code Status:    full  Family Communication:  No family at the bedside    Subjective: Patient is feeling better,but not yet back to baseline, no nausea or vomiting, no chest pain or dyspnea, continue to have episodic confusion per nursing.   Objective: Vitals:   12/31/20 0700 12/31/20 0800 12/31/20 0858 12/31/20 1100  BP: 105/67 (!) 117/92 138/83 135/85  Pulse: (!) 44 65 60 61  Resp: 17 11 18 20   Temp:   (!) 97.5 F (36.4 C) (!) 97.5 F (36.4 C)  TempSrc:  Oral Oral  SpO2: 100% 100% 100% 100%    Intake/Output Summary (Last 24 hours) at 12/31/2020 1448 Last data filed at 12/31/2020 1107 Gross per 24 hour  Intake 1480 ml  Output 400 ml  Net 1080 ml   There were no vitals filed for this visit.  Examination:   General: Not in pain or dyspnea, deconditioned  Neurology: Awake and alert, non focal  E ENT: no pallor, no icterus, oral  mucosa moist Cardiovascular: No JVD. S1-S2 present, bradycardic, rhythmic, no gallops, rubs, or murmurs. No lower extremity edema. Pulmonary: positive breath sounds bilaterally, adequate air movement, no wheezing, rhonchi or rales. Gastrointestinal. Abdomen soft and non tender Skin. No rashes Musculoskeletal: no joint deformities     Data Reviewed: I have personally reviewed following labs and imaging studies  CBC: Recent Labs  Lab 12/30/20 2046 12/30/20 2117 12/31/20 0230  WBC 4.1  --  4.4  NEUTROABS 2.3  --   --   HGB 12.2* 12.2* 11.9*  HCT 35.7* 36.0* 34.4*  MCV 86.9  --  87.3  PLT 164  --  [id number]*   Basic Metabolic Panel: Recent Labs  Lab 12/30/20 2046 12/30/20 2100 12/30/20 2117 12/31/20 0230  NA 135  --  136 136  K 3.9  --  3.8 4.0  CL 105  --   --  107  CO2 22  --   --  23  GLUCOSE 329*  --   --  302*  BUN 16  --   --  14  CREATININE 2.55*  --   --  1.94*  CALCIUM 8.6*  --   --  8.3*  MG  --  1.8  --   --   PHOS  --   --   --  3.4   GFR: CrCl cannot be calculated (Unknown ideal weight.). Liver Function Tests: Recent Labs  Lab 12/30/20 2046 12/31/20 0230  AST 16  --   ALT 13  --   ALKPHOS 54  --   BILITOT 0.4  --   PROT 5.7*  --   ALBUMIN 3.3* 3.0*   No results for input(s): LIPASE, AMYLASE in the last 168 hours. Recent Labs  Lab 12/30/20 2051  AMMONIA 26   Coagulation Profile: Recent Labs  Lab 12/30/20 2046  INR 1.1   Cardiac Enzymes: Recent Labs  Lab 12/31/20 0230  CKTOTAL 134   BNP (last 3 results) No results for input(s): PROBNP in the last 8760 hours. HbA1C: No results for input(s): HGBA1C in the last 72 hours. CBG: Recent Labs  Lab 12/30/20 2047 12/31/20 0225 12/31/20 0749 12/31/20 1136  GLUCAP 301* 268* 112* 332*   Lipid Profile: No results for input(s): CHOL, HDL, LDLCALC, TRIG, CHOLHDL, LDLDIRECT in the last 72 hours. Thyroid Function Tests: Recent Labs    12/30/20 2053  TSH 1.332   Anemia Panel: No results  for input(s): VITAMINB12, FOLATE, FERRITIN, TIBC, IRON, RETICCTPCT in the last 72 hours.    Radiology Studies: I have reviewed all of the imaging during this hospital visit personally     Scheduled Meds:  insulin aspart  0-15 Units Subcutaneous TID WC   insulin aspart  0-5 Units Subcutaneous QHS   insulin glargine-yfgn  10 Units Subcutaneous QHS   sodium chloride flush  3 mL Intravenous Q12H   Continuous Infusions:   LOS: 0 days        Domenick Quebedeaux Gerome Apley, MD

## 2020-12-31 NOTE — Progress Notes (Signed)
Pharmacy Antibiotic Note  Curtis Tanner is a 60 y.o. male admitted on 12/30/2020 with AMS. Blood cultures show GPC in 2/2 botttles and BCID shows staph epi with MEC resistance detected. Pharmacy consulted to dose vancomycin  -WBC= 4.4, afebrile -SCr= 1.94 (2.55 on admission; 1.4-1.8 in 09/2020)  Plan: -vancomycin 1750 mg IV x1 followed by 1000mg  IV q24h (estimated AUC= 505) -Will follow renal function, cultures and clinical progress     Height: 5\' 5"  (165.1 cm) Weight: 80.7 kg (178 lb) IBW/kg (Calculated) : 61.5  Temp (24hrs), Avg:97.6 F (36.4 C), Min:96.8 F (36 C), Max:98.6 F (37 C)  Recent Labs  Lab 12/30/20 2046 12/30/20 2051 12/31/20 0230  WBC 4.1  --  4.4  CREATININE 2.55*  --  1.94*  LATICACIDVEN  --  2.0* 1.3    Estimated Creatinine Clearance: 40.6 mL/min (A) (by C-G formula based on SCr of 1.94 mg/dL (H)).    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  13/4, PharmD Clinical Pharmacist **Pharmacist phone directory can now be found on [email or web address] (PW [email or web address]).  Listed under [facility].

## 2020-12-31 NOTE — Progress Notes (Signed)
[facility] night cross cover note:  I was contacted by inpatient pharmacist with result of 4 out of 4 bottles associated with blood cultures x2 drawn at time of admission showing gram-positive cocci, with preliminary blood culture ID consistent with staph epidermis along with a degree of methicillin resistance per preliminary sensitivities.  Patient noted to not currently be on antibiotics, inpatient pharmacist recommending consideration for initiation of IV vancomycin.  I subsequently conveyed verbal order for initiation of IV vancomycin for the above, with inpatient pharmacist confirming receipt of this verbal order. Will subsequently repeat blood cultures x 2.    Newton Pigg, DO Hospitalist

## 2020-12-31 NOTE — Plan of Care (Signed)
New pt admission from ED.  Vitals taken. Initial Assessment done. All immediate pertinent needs to patient addressed. Pt is confused. He is alert to person and place and disoriented to time and situation. MRSA swab sent, breakfast and lunch ordered. Pt does not have any family member to ask for history this time. Pt seems comfortable and denies pain, will continue to monitor the pain.  Lonia Farber, RN

## 2021-01-01 LAB — BASIC METABOLIC PANEL
Anion gap: 5 (ref 5–15)
BUN: 10 mg/dL (ref 6–20)
CO2: 21 mmol/L — ABNORMAL LOW (ref 22–32)
Calcium: 8.6 mg/dL — ABNORMAL LOW (ref 8.9–10.3)
Chloride: 109 mmol/L (ref 98–111)
Creatinine, Ser: 1.16 mg/dL (ref 0.61–1.24)
GFR, Estimated: >60 mL/min (ref >60)
Glucose, Bld: 167 mg/dL — ABNORMAL HIGH (ref 70–99)
Potassium: 3.3 mmol/L — ABNORMAL LOW (ref 3.5–5.1)
Sodium: 135 mmol/L (ref 135–145)

## 2021-01-01 LAB — GLUCOSE, CAPILLARY
Glucose-Capillary: 81 mg/dL (ref 70–99)
Glucose-Capillary: 205 mg/dL — ABNORMAL HIGH (ref 70–99)

## 2021-01-01 LAB — MAGNESIUM: Magnesium: 1.4 mg/dL — ABNORMAL LOW (ref 1.7–2.4)

## 2021-01-01 MED ORDER — ADULT MULTIVITAMIN W/MINERALS CH: 1.0000 | ORAL_TABLET | Freq: Every day | ORAL | Status: DC

## 2021-01-01 MED ORDER — POTASSIUM CHLORIDE CRYS ER 20 MEQ PO TBCR
40.0000 meq | EXTENDED_RELEASE_TABLET | Freq: Once | ORAL | Status: AC
  Administered 2021-01-01: 40 meq via ORAL
  Filled 2021-01-01: qty 2

## 2021-01-01 MED ORDER — MAGNESIUM SULFATE 2 GM/50ML IV SOLN
2.0000 g | Freq: Once | INTRAVENOUS | Status: AC
  Administered 2021-01-01: 2 g via INTRAVENOUS
  Filled 2021-01-01: qty 50

## 2021-01-01 MED ORDER — SODIUM CHLORIDE 0.9% FLUSH
3.0000 mL | Freq: Two times a day (BID) | INTRAVENOUS | Status: DC
  Administered 2021-01-01 (×2): 3 mL via INTRAVENOUS

## 2021-01-01 MED ORDER — SODIUM CHLORIDE 0.9% FLUSH: 3.0000 mL | INTRAVENOUS | Status: DC | PRN

## 2021-01-01 NOTE — Discharge Summary (Signed)
Physician Discharge Summary  Curtis Tanner [id number] DOB: [birth year] DOA: 12/30/2020  PCP: Neale Burly, MD  Admit date: 12/30/2020 Discharge date: 01/01/2021  Admitted From: Home  Disposition:  Home   Recommendations for Outpatient Follow-up and new medication changes:  Follow up with Dr. Sherrie Sport in 7 to 10 days.  Follow-up kidney function and electrolytes.  Home Health: no   Equipment/Devices: no    Discharge Condition: stable  CODE STATUS: full  Diet recommendation:  heart healthy   Brief/Interim Summary: [name] was admitted to the hospital with the working diagnosis of altered mental status and syncope.    60 yo male with the past medical history for asthma and type 2 diabetes mellitus who presented with altered mental status.  Patient was unable to give detailed history due to cognitive impairment.  Apparently he was acting unusual at the [employer], EMS was called and patient was found unresponsive.  His blood pressure was 60 and his heart rate 40s to 70s.  He was transported to the hospital for further evaluation.  On arrival to the ED patient had recovered his consciousness, reported feeling okay.  His initial blood pressure was 80 systolic, improved to [id number], temperature 96.8, heart rate 40- 50, his lungs were clear to auscultation bilaterally, heart S1-S2, present, bradycardic, abdomen soft nontender, no lower extremity edema.   Sodium 135, potassium 3.9, chloride 105, bicarb 22, glucose 329, BUN 16, creatinine 2.55, AST 16, ALT 13, CK1 34, lactic acid 2.0, white count 4.1, hemoglobin 12.2, hematocrit 35.7, platelets 164. SARS COVID-19 negative.   Urinalysis specific gravity 1.025.   Toxicology screen alcohol less than 10, positive tetrahydrocannabinol.   Head CT with generalized cerebral atrophy.  No acute changes.   Chest radiograph with no infiltrates.   EKG 42 bpm, left axis deviation, normal intervals, sinus rhythm, no significant ST segment or  T wave changes.  Acute altered mental status, syncope (bradycardia).  Patient was admitted to the medical ward, he was placed on a telemetry monitor. His syncope had vasovagal features.  He received intravenous fluids and close neurologic monitoring. Placed on thiamine and multivitamins.  Further work-up with echocardiography showed a preserved LV systolic function.  Ejection fraction 60 to 65%.  RV systolic function normal.  No significant valvular disease.  At the time of his discharge his mentation is back to baseline this was  confirmed by his sister.  Apparently patient does have early signs of dementia.  Note: two bottles blood culture positive for staph epidermidis, likely a contaminant.   2.  Acute kidney injury on chronic kidney disease stage 3a.  Hypokalemia and hypomagnesemia.  Patient was placed on intravenous fluids and his electrolytes were corrected. At the time of discharge patient is tolerating p.o. diet adequately.  Sodium 135, potassium 3.3, chloride 109, bicarb 21, glucose 167, BUN 10, creatinine 1.16, calcium 8.6, magnesium 1.4. Patient did receive potassium chloride and magnesium sulfate before his discharge. Follow-up kidney function and electrolytes as an outpatient.  3.  Type 2 diabetes mellitus.  Hyperglycemia.  Patient was placed on insulin sliding scale and basal insulin. At discharge patient tolerating p.o. diet adequately. Plan to resume his usual dose of insulin. His fasting glucose at discharge is 167.  4.  Hypertension.  His antihypertensive agents were held during his hospitalization.  His blood pressure has improved. Systolic 0000000 to [id number].  5. GERD.  Continue antiacid therapy.   Discharge Diagnoses:  Principal Problem:   Acute encephalopathy Active Problems:   Diabetes (  [place])   HTN (hypertension)   GERD (gastroesophageal reflux disease)   AKI (acute kidney injury) ([place])   Bradycardia   Altered mental status    Discharge  Instructions   Allergies as of 01/01/2021       Reactions   Ibuprofen Swelling   Latex Swelling   Heparin (bovine)    Other reaction(s): Other (See Comments) HIT        Medication List     TAKE these medications    HumaLOG 100 UNIT/ML injection Generic drug: insulin lispro Inject 25 Units into the skin 2 (two) times daily with a meal.   Lantus 100 UNIT/ML injection Generic drug: insulin glargine Inject 60 Units into the skin at bedtime.   multivitamin with minerals Tabs tablet Take 1 tablet by mouth daily. Start taking on: January 02, 2021        Allergies  Allergen Reactions   Ibuprofen Swelling   Latex Swelling   Heparin (Bovine)     Other reaction(s): Other (See Comments) HIT       Procedures/Studies: CT ABDOMEN PELVIS WO CONTRAST  Result Date: 12/30/2020 CLINICAL DATA:  Sepsis EXAM: CT ABDOMEN AND PELVIS WITHOUT CONTRAST TECHNIQUE: Multidetector CT imaging of the abdomen and pelvis was performed following the standard protocol without IV contrast. COMPARISON:  08/27/2003 FINDINGS: Lower chest: No acute abnormality. Hepatobiliary: Gallbladder is not well appreciated likely decompressed. Pancreas: Pancreas demonstrates calcifications and metallic densities along the tail possibly related to prior embolotherapy. Spleen: Normal in size without focal abnormality. Adrenals/Urinary Tract: Adrenal glands are within normal limits. Nonobstructing renal calculi are noted in the lower poles bilaterally. The largest of these lies on the right measuring 7 mm. No ureteral stones are seen. The bladder is partially distended. Stomach/Bowel: No obstructive or inflammatory changes of the colon are seen. The appendix is within normal limits. Small bowel and stomach are unremarkable. Vascular/Lymphatic: IVC filter is noted in place. No significant lymphadenopathy is seen. Atherosclerotic calcifications of the aorta are noted. Reproductive: Prostate is unremarkable. Other: No  abdominal wall hernia or abnormality. No abdominopelvic ascites. Musculoskeletal: No acute or significant osseous findings. 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 Nonobstructing renal calculi bilaterally. No other focal abnormality is noted. Electronically Signed   By: Inez Catalina M.D.   On: 12/30/2020 23:26   CT HEAD WO CONTRAST (5MM)  Result Date: 12/30/2020 CLINICAL DATA:  Altered mental status. EXAM: CT HEAD WITHOUT CONTRAST TECHNIQUE: Contiguous axial images were obtained from the base of the skull through the vertex without intravenous contrast. COMPARISON:  None. FINDINGS: Brain: There is mild cerebral atrophy with widening of the extra-axial spaces and ventricular dilatation. There are areas of decreased attenuation within the white matter tracts of the supratentorial brain, consistent with microvascular disease changes. Vascular: No hyperdense vessel or unexpected calcification. Skull: Normal. Negative for fracture or focal lesion. Sinuses/Orbits: No acute finding. Other: None. IMPRESSION: 1. Generalized cerebral atrophy. 2. No acute intracranial abnormality. Electronically Signed   By: Virgina Norfolk M.D.   On: 12/30/2020 21:46   DG Chest Port 1 View  Result Date: 12/30/2020 CLINICAL DATA:  Sepsis. EXAM: PORTABLE CHEST 1 VIEW COMPARISON:  08/31/2003 FINDINGS: The cardiomediastinal silhouette is within normal limits. The lungs are mildly hypoinflated with mild elevation of the right hemidiaphragm. Minimal bibasilar densities likely reflect atelectasis. The upper lungs are clear. No edema, sizable pleural effusion, or pneumothorax is identified. No acute osseous abnormality is seen.  IMPRESSION: Minimal bibasilar atelectasis. Electronically Signed   By: Logan Bores M.D.   On: 12/30/2020 21:18   ECHOCARDIOGRAM COMPLETE  Result Date: 12/31/2020    ECHOCARDIOGRAM REPORT   Patient Name:    MELESIO ETUE Rupe Date of Exam: 12/31/2020 Medical Rec #:  [id number]             Height:       65.0 in Accession #:    [id number]            Weight:       178.0 lb Date of Birth:  1961-01-05             BSA:          1.883 m Patient Age:    46 years              BP:           132/87 mmHg Patient Gender: M                     HR:           68 bpm. Exam Location:  Inpatient Procedure: 2D Echo, Cardiac Doppler and Color Doppler Indications:    Syncope  History:        Patient has no prior history of Echocardiogram examinations.  Sonographer:    Merrie Roof RDCS Referring Phys: [id number] [place]  1. Left ventricular ejection fraction, by estimation, is 60 to 65%. The left ventricle has normal function. The left ventricle has no regional wall motion abnormalities. Left ventricular diastolic parameters are indeterminate.  2. Right ventricular systolic function is normal. The right ventricular size is normal.  3. The mitral valve is abnormal. No evidence of mitral valve regurgitation. No evidence of mitral stenosis.  4. The aortic valve is tricuspid. Aortic valve regurgitation is not visualized. Mild aortic valve sclerosis is present, with no evidence of aortic valve stenosis.  5. The inferior vena cava is normal in size with greater than 50% respiratory variability, suggesting right atrial pressure of 3 mmHg. FINDINGS  Left Ventricle: Left ventricular ejection fraction, by estimation, is 60 to 65%. The left ventricle has normal function. The left ventricle has no regional wall motion abnormalities. The left ventricular internal cavity size was normal in size. There is  no left ventricular hypertrophy. Left ventricular diastolic parameters are indeterminate. Right Ventricle: The right ventricular size is normal. No increase in right ventricular wall thickness. Right ventricular systolic function is normal. Left Atrium: Left atrial size was normal in size. Right Atrium: Right atrial size was  normal in size. Pericardium: There is no evidence of pericardial effusion. Mitral Valve: The mitral valve is abnormal. There is mild thickening of the mitral valve leaflet(s). There is mild calcification of the mitral valve leaflet(s). Mild mitral annular calcification. No evidence of mitral valve regurgitation. No evidence of mitral valve stenosis. Tricuspid Valve: The tricuspid valve is normal in structure. Tricuspid valve regurgitation is not demonstrated. No evidence of tricuspid stenosis. Aortic Valve: The aortic valve is tricuspid. Aortic valve regurgitation is not visualized. Mild aortic valve sclerosis is present, with no evidence of aortic valve stenosis. Aortic valve mean gradient measures 5.0 mmHg. Aortic valve peak gradient measures 10.4 mmHg. Aortic valve area, by VTI measures 2.30 cm. Pulmonic Valve: The pulmonic valve was normal in structure. Pulmonic valve regurgitation is not visualized. No evidence of pulmonic stenosis. Aorta: The aortic root is normal in size and structure. Venous: The inferior  vena cava is normal in size with greater than 50% respiratory variability, suggesting right atrial pressure of 3 mmHg. IAS/Shunts: No atrial level shunt detected by color flow Doppler.  LEFT VENTRICLE PLAX 2D LVIDd:         4.10 cm   Diastology LVIDs:         2.60 cm   LV e' medial:    7.07 cm/s LV PW:         1.20 cm   LV E/e' medial:  16.5 LV IVS:        1.00 cm   LV e' lateral:   11.30 cm/s LVOT diam:     2.20 cm   LV E/e' lateral: 10.4 LV SV:         85 LV SV Index:   45 LVOT Area:     3.80 cm  RIGHT VENTRICLE RV Basal diam:  3.40 cm LEFT ATRIUM           Index        RIGHT ATRIUM           Index LA diam:      3.60 cm 1.91 cm/m   RA Area:     14.10 cm LA Vol (A2C): 53.3 ml 28.31 ml/m  RA Volume:   35.10 ml  18.64 ml/m LA Vol (A4C): 78.2 ml 41.54 ml/m  AORTIC VALVE AV Area (Vmax):    2.64 cm AV Area (Vmean):   2.48 cm AV Area (VTI):     2.30 cm AV Vmax:           161.00 cm/s AV Vmean:           103.000 cm/s AV VTI:            0.368 m AV Peak Grad:      10.4 mmHg AV Mean Grad:      5.0 mmHg LVOT Vmax:         112.00 cm/s LVOT Vmean:        67.100 cm/s LVOT VTI:          0.223 m LVOT/AV VTI ratio: 0.61  AORTA Ao Root diam: 3.10 cm MITRAL VALVE MV Area (PHT): 4.06 cm     SHUNTS MV Decel Time: 187 msec     Systemic VTI:  0.22 m MV E velocity: 117.00 cm/s  Systemic Diam: 2.20 cm MV A velocity: 93.30 cm/s MV E/A ratio:  1.25 Jenkins Rouge MD Electronically signed by Jenkins Rouge MD Signature Date/Time: 12/31/2020/5:55:46 PM    Final     Subjective: Patient is feeling better, no nausea or vomiting, no chest pain or dyspnea. Tolerating po well.   Discharge Exam: Vitals:   01/01/21 0700 01/01/21 0755  BP: (!) 148/89 (!) 148/89  Pulse: (!) 53 77  Resp: 18 17  Temp:  98.3 F (36.8 C)  SpO2: 98% 100%   Vitals:   01/01/21 0344 01/01/21 0500 01/01/21 0700 01/01/21 0755  BP: (!) 149/93 (!) 150/88 (!) 148/89 (!) 148/89  Pulse: (!) 48 (!) 55 (!) 53 77  Resp: 15 16 18 17   Temp: 98.3 F (36.8 C)   98.3 F (36.8 C)  TempSrc: Oral   Oral  SpO2: 97%  98% 100%  Weight:  78.9 kg    Height:        General: Not in pain or dyspnea  Neurology: Awake and alert, non focal  E ENT: no pallor, no icterus, oral mucosa moist Cardiovascular: No JVD. S1-S2 present, rhythmic, no  gallops, rubs, or murmurs. No lower extremity edema. Pulmonary: positive breath sounds bilaterally, adequate air movement, no wheezing, rhonchi or rales. Gastrointestinal. Abdomen soft and non tender Skin. No rashes Musculoskeletal: no joint deformities   The results of significant diagnostics from this hospitalization (including imaging, microbiology, ancillary and laboratory) are listed below for reference.     Microbiology: Recent Results (from the past 240 hour(s))  Culture, blood (Routine x 2)     Status: None (Preliminary result)   Collection Time: 12/30/20  8:46 PM   Specimen: BLOOD RIGHT ARM  Result Value Ref Range  Status   Specimen Description BLOOD RIGHT ARM  Final   Special Requests   Final    BOTTLES DRAWN AEROBIC AND ANAEROBIC Blood Culture results may not be optimal due to an inadequate volume of blood received in culture bottles   Culture   Final    NO GROWTH < 24 HOURS Performed at [facility], [place] [address] [place], [place] [address]    Report Status PENDING  Incomplete  Resp Panel by RT-PCR (Flu A&B, Covid) Nasopharyngeal Swab     Status: None   Collection Time: 12/30/20  8:56 PM   Specimen: Nasopharyngeal Swab; Nasopharyngeal(NP) swabs in vial transport medium  Result Value Ref Range Status   SARS Coronavirus 2 by RT PCR NEGATIVE NEGATIVE Final    Comment: (NOTE) SARS-CoV-2 target nucleic acids are NOT DETECTED.  The SARS-CoV-2 RNA is generally detectable in upper respiratory specimens during the acute phase of infection. The lowest concentration of SARS-CoV-2 viral copies this assay can detect is 138 copies/mL. A negative result does not preclude SARS-Cov-2 infection and should not be used as the sole basis for treatment or other patient management decisions. A negative result may occur with  improper specimen collection/handling, submission of specimen other than nasopharyngeal swab, presence of viral mutation(s) within the areas targeted by this assay, and inadequate number of viral copies(<138 copies/mL). A negative result must be combined with clinical observations, patient history, and epidemiological information. The expected result is Negative.  Fact Sheet for Patients:  EntrepreneurPulse.com.au  Fact Sheet for Healthcare Providers:  IncredibleEmployment.be  This test is no t yet approved or cleared by the [place] FDA and  has been authorized for detection and/or diagnosis of SARS-CoV-2 by FDA under an Emergency Use Authorization (EUA). This EUA will remain  in effect (meaning this test can be used) for the duration of  the COVID-19 declaration under Section 564(b)(1) of the Act, 21 U.S.C.section 360bbb-3(b)(1), unless the authorization is terminated  or revoked sooner.       Influenza A by PCR NEGATIVE NEGATIVE Final   Influenza B by PCR NEGATIVE NEGATIVE Final    Comment: (NOTE) The Xpert Xpress SARS-CoV-2/FLU/RSV plus assay is intended as an aid in the diagnosis of influenza from Nasopharyngeal swab specimens and should not be used as a sole basis for treatment. Nasal washings and aspirates are unacceptable for Xpert Xpress SARS-CoV-2/FLU/RSV testing.  Fact Sheet for Patients: EntrepreneurPulse.com.au  Fact Sheet for Healthcare Providers: IncredibleEmployment.be  This test is not yet approved or cleared by the [place] FDA and has been authorized for detection and/or diagnosis of SARS-CoV-2 by FDA under an Emergency Use Authorization (EUA). This EUA will remain in effect (meaning this test can be used) for the duration of the COVID-19 declaration under Section 564(b)(1) of the Act, 21 U.S.C. section 360bbb-3(b)(1), unless the authorization is terminated or revoked.  Performed at [facility], [place]  [address], [place], [place] [address]   Culture, blood (Routine x 2)     Status: None (Preliminary result)   Collection Time: 12/31/20  2:30 AM   Specimen: BLOOD RIGHT ARM  Result Value Ref Range Status   Specimen Description BLOOD RIGHT ARM  Final   Special Requests   Final    BOTTLES DRAWN AEROBIC AND ANAEROBIC Blood Culture adequate volume   Culture  Setup Time   Final    GRAM POSITIVE COCCI IN BOTH AEROBIC AND ANAEROBIC BOTTLES CRITICAL RESULT CALLED TO, READ BACK BY AND VERIFIED WITH: PHARM A.Prudence Davidson ON [id number] AT 2026 BY E.PARRISH Performed at [facility], [place] [address] [place], [place] [address]    Culture GRAM POSITIVE COCCI  Final   Report Status PENDING  Incomplete  Blood Culture ID Panel (Reflexed)     Status: Abnormal    Collection Time: 12/31/20  2:30 AM  Result Value Ref Range Status   Enterococcus faecalis NOT DETECTED NOT DETECTED Final   Enterococcus Faecium NOT DETECTED NOT DETECTED Final   Listeria monocytogenes NOT DETECTED NOT DETECTED Final   Staphylococcus species DETECTED (A) NOT DETECTED Final    Comment: CRITICAL RESULT CALLED TO, READ BACK BY AND VERIFIED WITH: PHARM A.MAYER ON [id number] AT 2026 BY E.PARRISH    Staphylococcus aureus (BCID) NOT DETECTED NOT DETECTED Final   Staphylococcus epidermidis DETECTED (A) NOT DETECTED Final    Comment: Methicillin (oxacillin) resistant coagulase negative staphylococcus. Possible blood culture contaminant (unless isolated from more than one blood culture draw or clinical case suggests pathogenicity). No antibiotic treatment is indicated for blood  culture contaminants. CRITICAL RESULT CALLED TO, READ BACK BY AND VERIFIED WITH: PHARM A.MAYER ON [id number] AT 2026 BY E.PARRISH    Staphylococcus lugdunensis NOT DETECTED NOT DETECTED Final   Streptococcus species NOT DETECTED NOT DETECTED Final   Streptococcus agalactiae NOT DETECTED NOT DETECTED Final   Streptococcus pneumoniae NOT DETECTED NOT DETECTED Final   Streptococcus pyogenes NOT DETECTED NOT DETECTED Final   A.calcoaceticus-baumannii NOT DETECTED NOT DETECTED Final   Bacteroides fragilis NOT DETECTED NOT DETECTED Final   Enterobacterales NOT DETECTED NOT DETECTED Final   Enterobacter cloacae complex NOT DETECTED NOT DETECTED Final   Escherichia coli NOT DETECTED NOT DETECTED Final   Klebsiella aerogenes NOT DETECTED NOT DETECTED Final   Klebsiella oxytoca NOT DETECTED NOT DETECTED Final   Klebsiella pneumoniae NOT DETECTED NOT DETECTED Final   Proteus species NOT DETECTED NOT DETECTED Final   Salmonella species NOT DETECTED NOT DETECTED Final   Serratia marcescens NOT DETECTED NOT DETECTED Final   Haemophilus influenzae NOT DETECTED NOT DETECTED Final   Neisseria meningitidis NOT DETECTED NOT  DETECTED Final   Pseudomonas aeruginosa NOT DETECTED NOT DETECTED Final   Stenotrophomonas maltophilia NOT DETECTED NOT DETECTED Final   Candida albicans NOT DETECTED NOT DETECTED Final   Candida auris NOT DETECTED NOT DETECTED Final   Candida glabrata NOT DETECTED NOT DETECTED Final   Candida krusei NOT DETECTED NOT DETECTED Final   Candida parapsilosis NOT DETECTED NOT DETECTED Final   Candida tropicalis NOT DETECTED NOT DETECTED Final   Cryptococcus neoformans/gattii NOT DETECTED NOT DETECTED Final   Methicillin resistance mecA/C DETECTED (A) NOT DETECTED Final    Comment: CRITICAL RESULT CALLED TO, READ BACK BY AND VERIFIED WITH: PHARM A.Prudence Davidson ON [id number] AT 2026 BY E.PARRISH Performed at [facility], [place] [address], [place], [place] [address]   MRSA Next Gen by PCR, Nasal     Status: None  Collection Time: 12/31/20  8:55 AM   Specimen: Nasal Mucosa; Nasal Swab  Result Value Ref Range Status   MRSA by PCR Next Gen NOT DETECTED NOT DETECTED Final    Comment: (NOTE) The GeneXpert MRSA Assay (FDA approved for NASAL specimens only), is one component of a comprehensive MRSA colonization surveillance program. It is not intended to diagnose MRSA infection nor to guide or monitor treatment for MRSA infections. Test performance is not FDA approved in patients less than 90 years old. Performed at [facility], [place] [address] [place], [place] [address]      Labs: BNP (last 3 results) No results for input(s): BNP in the last 8760 hours. Basic Metabolic Panel: Recent Labs  Lab 12/30/20 2046 12/30/20 2100 12/30/20 2117 12/31/20 0230 01/01/21 0039  NA 135  --  136 136 135  K 3.9  --  3.8 4.0 3.3*  CL 105  --   --  107 109  CO2 22  --   --  23 21*  GLUCOSE 329*  --   --  302* 167*  BUN 16  --   --  14 10  CREATININE 2.55*  --   --  1.94* 1.16  CALCIUM 8.6*  --   --  8.3* 8.6*  MG  --  1.8  --   --  1.4*  PHOS  --   --   --  3.4  --    Liver Function  Tests: Recent Labs  Lab 12/30/20 2046 12/31/20 0230  AST 16  --   ALT 13  --   ALKPHOS 54  --   BILITOT 0.4  --   PROT 5.7*  --   ALBUMIN 3.3* 3.0*   No results for input(s): LIPASE, AMYLASE in the last 168 hours. Recent Labs  Lab 12/30/20 2051  AMMONIA 26   CBC: Recent Labs  Lab 12/30/20 2046 12/30/20 2117 12/31/20 0230  WBC 4.1  --  4.4  NEUTROABS 2.3  --   --   HGB 12.2* 12.2* 11.9*  HCT 35.7* 36.0* 34.4*  MCV 86.9  --  87.3  PLT 164  --  133*   Cardiac Enzymes: Recent Labs  Lab 12/31/20 0230  CKTOTAL 134   BNP: Invalid input(s): POCBNP CBG: Recent Labs  Lab 12/31/20 0749 12/31/20 1136 12/31/20 1540 12/31/20 2056 01/01/21 0620  GLUCAP 112* 332* 114* 176* 81   D-Dimer No results for input(s): DDIMER in the last 72 hours. Hgb A1c No results for input(s): HGBA1C in the last 72 hours. Lipid Profile No results for input(s): CHOL, HDL, LDLCALC, TRIG, CHOLHDL, LDLDIRECT in the last 72 hours. Thyroid function studies Recent Labs    12/30/20 2053  TSH 1.332   Anemia work up No results for input(s): VITAMINB12, FOLATE, FERRITIN, TIBC, IRON, RETICCTPCT in the last 72 hours. Urinalysis    Component Value Date/Time   COLORURINE YELLOW 12/30/2020 2241   APPEARANCEUR CLEAR 12/30/2020 2241   LABSPEC 1.025 12/30/2020 2241   PHURINE 5.0 12/30/2020 2241   GLUCOSEU >=500 (A) 12/30/2020 2241   HGBUR NEGATIVE 12/30/2020 2241   BILIRUBINUR NEGATIVE 12/30/2020 2241   KETONESUR NEGATIVE 12/30/2020 2241   PROTEINUR NEGATIVE 12/30/2020 2241   NITRITE NEGATIVE 12/30/2020 2241   LEUKOCYTESUR NEGATIVE 12/30/2020 2241   Sepsis Labs Invalid input(s): PROCALCITONIN,  WBC,  LACTICIDVEN Microbiology Recent Results (from the past 240 hour(s))  Culture, blood (Routine x 2)     Status: None (Preliminary result)   Collection Time: 12/30/20  8:46 PM   Specimen: BLOOD RIGHT ARM  Result Value Ref Range Status   Specimen Description BLOOD RIGHT ARM  Final   Special  Requests   Final    BOTTLES DRAWN AEROBIC AND ANAEROBIC Blood Culture results may not be optimal due to an inadequate volume of blood received in culture bottles   Culture   Final    NO GROWTH < 24 HOURS Performed at [facility], [address] [place], [place] [address]    Report Status PENDING  Incomplete  Resp Panel by RT-PCR (Flu A&B, Covid) Nasopharyngeal Swab     Status: None   Collection Time: 12/30/20  8:56 PM   Specimen: Nasopharyngeal Swab; Nasopharyngeal(NP) swabs in vial transport medium  Result Value Ref Range Status   SARS Coronavirus 2 by RT PCR NEGATIVE NEGATIVE Final    Comment: (NOTE) SARS-CoV-2 target nucleic acids are NOT DETECTED.  The SARS-CoV-2 RNA is generally detectable in upper respiratory specimens during the acute phase of infection. The lowest concentration of SARS-CoV-2 viral copies this assay can detect is 138 copies/mL. A negative result does not preclude SARS-Cov-2 infection and should not be used as the sole basis for treatment or other patient management decisions. A negative result may occur with  improper specimen collection/handling, submission of specimen other than nasopharyngeal swab, presence of viral mutation(s) within the areas targeted by this assay, and inadequate number of viral copies(<138 copies/mL). A negative result must be combined with clinical observations, patient history, and epidemiological information. The expected result is Negative.  Fact Sheet for Patients:  BloggerCourse.com  Fact Sheet for Healthcare Providers:  SeriousBroker.it  This test is no t yet approved or cleared by the [place] FDA and  has been authorized for detection and/or diagnosis of SARS-CoV-2 by FDA under an Emergency Use Authorization (EUA). This EUA will remain  in effect (meaning this test can be used) for the duration of the COVID-19 declaration under Section 564(b)(1) of the Act,  21 U.S.C.section 360bbb-3(b)(1), unless the authorization is terminated  or revoked sooner.       Influenza A by PCR NEGATIVE NEGATIVE Final   Influenza B by PCR NEGATIVE NEGATIVE Final    Comment: (NOTE) The Xpert Xpress SARS-CoV-2/FLU/RSV plus assay is intended as an aid in the diagnosis of influenza from Nasopharyngeal swab specimens and should not be used as a sole basis for treatment. Nasal washings and aspirates are unacceptable for Xpert Xpress SARS-CoV-2/FLU/RSV testing.  Fact Sheet for Patients: BloggerCourse.com  Fact Sheet for Healthcare Providers: SeriousBroker.it  This test is not yet approved or cleared by the [place] FDA and has been authorized for detection and/or diagnosis of SARS-CoV-2 by FDA under an Emergency Use Authorization (EUA). This EUA will remain in effect (meaning this test can be used) for the duration of the COVID-19 declaration under Section 564(b)(1) of the Act, 21 U.S.C. section 360bbb-3(b)(1), unless the authorization is terminated or revoked.  Performed at [facility], [address] [place], [place] [address]   Culture, blood (Routine x 2)     Status: None (Preliminary result)   Collection Time: 12/31/20  2:30 AM   Specimen: BLOOD RIGHT ARM  Result Value Ref Range Status   Specimen Description BLOOD RIGHT ARM  Final   Special Requests   Final    BOTTLES DRAWN AEROBIC AND ANAEROBIC Blood Culture adequate volume   Culture  Setup Time   Final    GRAM POSITIVE COCCI IN BOTH AEROBIC AND ANAEROBIC  BOTTLES CRITICAL RESULT CALLED TO, READ BACK BY AND VERIFIED WITH: PHARM A.Stacie AcresMAYER ON [id number] AT 2026 BY E.PARRISH Performed at [facility], [address] [place], [place] [address]    Culture GRAM POSITIVE COCCI  Final   Report Status PENDING  Incomplete  Blood Culture ID Panel (Reflexed)     Status: Abnormal   Collection Time: 12/31/20  2:30 AM  Result Value Ref Range  Status   Enterococcus faecalis NOT DETECTED NOT DETECTED Final   Enterococcus Faecium NOT DETECTED NOT DETECTED Final   Listeria monocytogenes NOT DETECTED NOT DETECTED Final   Staphylococcus species DETECTED (A) NOT DETECTED Final    Comment: CRITICAL RESULT CALLED TO, READ BACK BY AND VERIFIED WITH: PHARM A.MAYER ON [id number] AT 2026 BY E.PARRISH    Staphylococcus aureus (BCID) NOT DETECTED NOT DETECTED Final   Staphylococcus epidermidis DETECTED (A) NOT DETECTED Final    Comment: Methicillin (oxacillin) resistant coagulase negative staphylococcus. Possible blood culture contaminant (unless isolated from more than one blood culture draw or clinical case suggests pathogenicity). No antibiotic treatment is indicated for blood  culture contaminants. CRITICAL RESULT CALLED TO, READ BACK BY AND VERIFIED WITH: PHARM A.MAYER ON [id number] AT 2026 BY E.PARRISH    Staphylococcus lugdunensis NOT DETECTED NOT DETECTED Final   Streptococcus species NOT DETECTED NOT DETECTED Final   Streptococcus agalactiae NOT DETECTED NOT DETECTED Final   Streptococcus pneumoniae NOT DETECTED NOT DETECTED Final   Streptococcus pyogenes NOT DETECTED NOT DETECTED Final   A.calcoaceticus-baumannii NOT DETECTED NOT DETECTED Final   Bacteroides fragilis NOT DETECTED NOT DETECTED Final   Enterobacterales NOT DETECTED NOT DETECTED Final   Enterobacter cloacae complex NOT DETECTED NOT DETECTED Final   Escherichia coli NOT DETECTED NOT DETECTED Final   Klebsiella aerogenes NOT DETECTED NOT DETECTED Final   Klebsiella oxytoca NOT DETECTED NOT DETECTED Final   Klebsiella pneumoniae NOT DETECTED NOT DETECTED Final   Proteus species NOT DETECTED NOT DETECTED Final   Salmonella species NOT DETECTED NOT DETECTED Final   Serratia marcescens NOT DETECTED NOT DETECTED Final   Haemophilus influenzae NOT DETECTED NOT DETECTED Final   Neisseria meningitidis NOT DETECTED NOT DETECTED Final   Pseudomonas aeruginosa NOT DETECTED NOT  DETECTED Final   Stenotrophomonas maltophilia NOT DETECTED NOT DETECTED Final   Candida albicans NOT DETECTED NOT DETECTED Final   Candida auris NOT DETECTED NOT DETECTED Final   Candida glabrata NOT DETECTED NOT DETECTED Final   Candida krusei NOT DETECTED NOT DETECTED Final   Candida parapsilosis NOT DETECTED NOT DETECTED Final   Candida tropicalis NOT DETECTED NOT DETECTED Final   Cryptococcus neoformans/gattii NOT DETECTED NOT DETECTED Final   Methicillin resistance mecA/C DETECTED (A) NOT DETECTED Final    Comment: CRITICAL RESULT CALLED TO, READ BACK BY AND VERIFIED WITH: PHARM A.Stacie AcresMAYER ON [id number] AT 2026 BY E.PARRISH Performed at [facility], [address] [place], [place] [address]   MRSA Next Gen by PCR, Nasal     Status: None   Collection Time: 12/31/20  8:55 AM   Specimen: Nasal Mucosa; Nasal Swab  Result Value Ref Range Status   MRSA by PCR Next Gen NOT DETECTED NOT DETECTED Final    Comment: (NOTE) The GeneXpert MRSA Assay (FDA approved for NASAL specimens only), is one component of a comprehensive MRSA colonization surveillance program. It is not intended to diagnose MRSA infection nor to guide or monitor treatment for MRSA infections. Test performance is not FDA approved in patients less than 58 years old.  Performed at [facility], [place] [address], [place], [place] [address]      Time coordinating discharge: 45 minutes  SIGNED:   Tawni Millers, MD  [facility] 01/01/2021, 8:47 AM

## 2021-01-01 NOTE — Progress Notes (Signed)
Pt got discharged to home, discharge instructions provided to the Nephew and the patient, IV taken out,Telemonitor DC,pt left unit in wheelchair with all of the belongings accompanied with a family member (Aurther Loft)  Lonia Farber, Charity fundraiser

## 2021-01-01 NOTE — Evaluation (Signed)
Physical Therapy Evaluation Patient Details Name: Curtis Tanner MRN: [id number] DOB: 01/27/1961 Today's Date: 01/01/2021  History of Present Illness  Pt is a 60 yo male admitted 11/4 with acute encephalopathy. PMH:  asthma and type 2 diabetes mellitus  Clinical Impression  PT eval complete. Pt required supervision transfers and min guard assist ambulation 250' without AD. Mildly unsteady gait noted. Unsure of pt's baseline cognitive function. Would benefit from maximizing family supervision upon discharge. No DME or PT follow up needs. PT signing off.     Recommendations for follow up therapy are one component of a multi-disciplinary discharge planning process, led by the attending physician.  Recommendations may be updated based on patient status, additional functional criteria and insurance authorization.  Follow Up Recommendations No PT follow up    Assistance Recommended at Discharge Frequent or constant Supervision/Assistance  Functional Status Assessment Patient has had a recent decline in their functional status and/or demonstrates limited ability to make significant improvements in function in a reasonable and predictable amount of time  Equipment Recommendations  None recommended by PT    Recommendations for Other Services       Precautions / Restrictions Precautions Precautions: Fall      Mobility  Bed Mobility Overal bed mobility: Modified Independent                  Transfers Overall transfer level: Needs assistance Equipment used: None Transfers: Sit to/from [employer] Sit to Stand: Supervision Stand pivot transfers: Supervision         General transfer comment: supervision for safety    Ambulation/Gait Ambulation/Gait assistance: Min guard Gait Distance (Feet): 250 Feet Assistive device: None Gait Pattern/deviations: WFL(Within Functional Limits) Gait velocity: decreased Gait velocity interpretation: 1.31 - 2.62  ft/sec, indicative of limited community ambulator General Gait Details: mildly unsteady but no overt LOB. No physical assist needed.  Stairs            Wheelchair Mobility    Modified Rankin (Stroke Patients Only)       Balance Overall balance assessment: Mild deficits observed, not formally tested                                           Pertinent Vitals/Pain Pain Assessment: No/denies pain    Home Living Family/patient expects to be discharged to:: Private residence Living Arrangements: Other relatives (uncle) Available Help at Discharge: Family;Available PRN/intermittently Type of Home: Apartment Home Access: Level entry       Home Layout: One level Home Equipment: Cane - single point      Prior Function Prior Level of Function : Independent/Modified Independent             Mobility Comments: recently needed a cane due to twisting his ankle       Hand Dominance        Extremity/Trunk Assessment   Upper Extremity Assessment Upper Extremity Assessment: Overall WFL for tasks assessed    Lower Extremity Assessment Lower Extremity Assessment: Overall WFL for tasks assessed    Cervical / Trunk Assessment Cervical / Trunk Assessment: Normal  Communication   Communication: No difficulties  Cognition Arousal/Alertness: Awake/alert Behavior During Therapy: Flat affect Overall Cognitive Status: No family/caregiver present to determine baseline cognitive functioning  General Comments: Unsure of cognition at baseline. Disoriented to time. Slow to respond. Poor historian.        General Comments General comments (skin integrity, edema, etc.): VSS on RA    Exercises     Assessment/Plan    PT Assessment Patient does not need any further PT services  PT Problem List         PT Treatment Interventions      PT Goals (Current goals can be found in the Care Plan section)  Acute  Rehab PT Goals Patient Stated Goal: not stated PT Goal Formulation: All assessment and education complete, DC therapy    Frequency     Barriers to discharge        Co-evaluation               AM-PAC PT "6 Clicks" Mobility  Outcome Measure Help needed turning from your back to your side while in a flat bed without using bedrails?: None Help needed moving from lying on your back to sitting on the side of a flat bed without using bedrails?: None Help needed moving to and from a bed to a chair (including a wheelchair)?: A Little Help needed standing up from a chair using your arms (e.g., wheelchair or bedside chair)?: A Little Help needed to walk in hospital room?: A Little Help needed climbing 3-5 steps with a railing? : A Little 6 Click Score: 20    End of Session Equipment Utilized During Treatment: Gait belt Activity Tolerance: Patient tolerated treatment well Patient left: in chair;with call bell/phone within reach Nurse Communication: Mobility status PT Visit Diagnosis: Unsteadiness on feet (R26.81)    Time: 6294-7654 PT Time Calculation (min) (ACUTE ONLY): 21 min   Charges:   PT Evaluation $PT Eval Moderate Complexity: 1 Mod          Aida Raider, PT  Office # [phone] Pager [phone]   Ilda Foil 01/01/2021, 11:03 AM

## 2021-01-01 NOTE — Progress Notes (Signed)
[facility] night cross cover note:  I was notified of the following morning lab results: Serum potassium 3.3, serum magnesium 1.4. I ordered potassium chloride 40 mEq p.o. x1 as well as magnesium sulfate 2 g IV over 2 hours x1 dose now.     Newton Pigg, DO Hospitalist

## 2021-01-01 NOTE — Evaluation (Signed)
Occupational Therapy Evaluation Patient Details Name: Curtis Tanner MRN: [id number] DOB: [birth year] Today's Date: 01/01/2021   History of Present Illness Pt is a 60 yo male admitted 11/4 with acute encephalopathy. PMH:  asthma and type 2 diabetes mellitus   Clinical Impression   Pt pleasant, but without family in room to confirm social or PLOF. Endorses being mod indep with SPC as needed in the home of which he lives with his uncle. Pt does demo slowed processing, decreased insight and is only oriented x2 despite cues by therapist for use of environmental cues. Overall pt requiring Sup for functional transfers and seated/standing ADLs without LOB, so at this time does not present with acute OT needs, but OT would strongly recommend strict 24hr S/A of family/friend for increased A with med management/bills and driving at this time. Pt may benefit from [facility] consult for safety assessment with ADLs/IADLs in home for decreased risk for hospital readmission. OT will sign off acutely.       Recommendations for follow up therapy are one component of a multi-disciplinary discharge planning process, led by the attending physician.  Recommendations may be updated based on patient status, additional functional criteria and insurance authorization.   Follow Up Recommendations  Other (comment) (may benefit from increased S/A from able bodied family member with consideration of HHOT consult for safety assessment if able)    Assistance Recommended at Discharge Frequent or constant Supervision/Assistance  Functional Status Assessment     Equipment Recommendations  None recommended by OT    Recommendations for Other Services       Precautions / Restrictions Precautions Precautions: Fall Restrictions Weight Bearing Restrictions: No      Mobility Bed Mobility Overal bed mobility: Modified Independent                  Transfers Overall transfer level: Needs assistance Equipment  used: None Transfers: Sit to/from Stand;Stand Pivot Transfers Sit to Stand: Supervision Stand pivot transfers: Supervision         General transfer comment: Sup for safety, cues for technique to maximize safet      Balance Overall balance assessment: Mild deficits observed, not formally tested                                         ADL either performed or assessed with clinical judgement   ADL                                               Vision Baseline Vision/History: 0 No visual deficits       Perception     Praxis      Pertinent Vitals/Pain Pain Assessment: No/denies pain     Hand Dominance     Extremity/Trunk Assessment Upper Extremity Assessment Upper Extremity Assessment: Overall WFL for tasks assessed   Lower Extremity Assessment Lower Extremity Assessment: Defer to PT evaluation   Cervical / Trunk Assessment Cervical / Trunk Assessment: Normal   Communication Communication Communication: No difficulties   Cognition Arousal/Alertness: Awake/alert Behavior During Therapy: Flat affect Overall Cognitive Status: No family/caregiver present to determine baseline cognitive functioning  General Comments: pt with slowed processing and response time, difficulty with reporting PLOF and orientation. no family to confirm baseline cognition.     General Comments  noted pt with elevated BP throughout session limiting further bouts of transfers and mobility but pt asymptomatic.    Exercises     Shoulder Instructions      Home Living Family/patient expects to be discharged to:: Private residence Living Arrangements: Other relatives Terrence Dupont) Available Help at Discharge: Family;Available PRN/intermittently Type of Home: Apartment Home Access: Level entry     Home Layout: One level     Bathroom Shower/Tub: Tub/shower unit         Home Equipment: Cane - single point           Prior Functioning/Environment Prior Level of Function : Independent/Modified Independent             Mobility Comments: recently needed a cane due to twisting his ankle ADLs Comments: endorses being mod indep with ADLs, and medication. sister providing assist for driving needs. no family in room to confirm social hx or PLOF        OT Problem List:        OT Treatment/Interventions:      OT Goals(Current goals can be found in the care plan section) Acute Rehab OT Goals OT Goal Formulation: All assessment and education complete, DC therapy  OT Frequency:     Barriers to D/C:    unclear of level of S/A available at time of dc          AM-PAC OT "6 Clicks" Daily Activity     Outcome Measure Help from another person eating meals?: None Help from another person taking care of personal grooming?: A Little Help from another person toileting, which includes using toliet, bedpan, or urinal?: A Little Help from another person bathing (including washing, rinsing, drying)?: A Little Help from another person to put on and taking off regular upper body clothing?: A Little Help from another person to put on and taking off regular lower body clothing?: A Little 6 Click Score: 19   End of Session Equipment Utilized During Treatment: Rolling walker (2 wheels) (RW provided on single trial for safety) Nurse Communication: Mobility status  Activity Tolerance: Patient tolerated treatment well Patient left: in chair;with call bell/phone within reach;with chair alarm set  OT Visit Diagnosis: Unsteadiness on feet (R26.81)                Time: 1040-1052 OT Time Calculation (min): 12 min Charges:  OT General Charges $OT Visit: 1 Visit OT Evaluation $OT Eval Low Complexity: 1 Low  Jame Morrell OTR/L acute rehab services Office: [phone]   [place] [employer] Nehemiah Montee 01/01/2021, 10:52 AM

## 2021-01-01 NOTE — Plan of Care (Signed)

## 2021-01-02 LAB — CULTURE, BLOOD (ROUTINE X 2): Special Requests: ADEQUATE

## 2021-01-02 LAB — URINE CULTURE: Culture: 60000 — AB

## 2021-01-04 LAB — CULTURE, BLOOD (ROUTINE X 2): Culture: NO GROWTH

## 2021-01-06 LAB — CULTURE, BLOOD (ROUTINE X 2)
Culture: NO GROWTH
Culture: NO GROWTH
Special Requests: ADEQUATE

## 2021-09-20 ENCOUNTER — Ambulatory Visit: Payer: Medicaid Other | Admitting: Nurse Practitioner

## 2022-10-22 ENCOUNTER — Encounter (HOSPITAL_COMMUNITY): Payer: Self-pay | Admitting: Internal Medicine

## 2022-10-22 ENCOUNTER — Other Ambulatory Visit: Payer: Self-pay

## 2022-10-22 ENCOUNTER — Emergency Department (HOSPITAL_COMMUNITY): Payer: Medicare Other

## 2022-10-22 ENCOUNTER — Inpatient Hospital Stay (HOSPITAL_COMMUNITY)
Admission: EM | Admit: 2022-10-22 | Discharge: 2022-10-24 | DRG: 637 | Disposition: A | Discharge status: Home / Self Care | Payer: Medicare Other | Attending: Internal Medicine | Admitting: Internal Medicine

## 2022-10-22 DIAGNOSIS — I10 Essential (primary) hypertension: Secondary | ICD-10-CM | POA: Diagnosis present

## 2022-10-22 DIAGNOSIS — R680 Hypothermia, not associated with low environmental temperature: Secondary | ICD-10-CM | POA: Diagnosis present

## 2022-10-22 DIAGNOSIS — E1165 Type 2 diabetes mellitus with hyperglycemia: Secondary | ICD-10-CM | POA: Diagnosis not present

## 2022-10-22 DIAGNOSIS — Z1152 Encounter for screening for COVID-19: Secondary | ICD-10-CM

## 2022-10-22 DIAGNOSIS — Z888 Allergy status to other drugs, medicaments and biological substances status: Secondary | ICD-10-CM

## 2022-10-22 DIAGNOSIS — Z794 Long term (current) use of insulin: Secondary | ICD-10-CM | POA: Diagnosis not present

## 2022-10-22 DIAGNOSIS — E119 Type 2 diabetes mellitus without complications: Secondary | ICD-10-CM | POA: Diagnosis not present

## 2022-10-22 DIAGNOSIS — E162 Hypoglycemia, unspecified: Principal | ICD-10-CM

## 2022-10-22 DIAGNOSIS — Z7984 Long term (current) use of oral hypoglycemic drugs: Secondary | ICD-10-CM | POA: Diagnosis not present

## 2022-10-22 DIAGNOSIS — D696 Thrombocytopenia, unspecified: Secondary | ICD-10-CM | POA: Diagnosis present

## 2022-10-22 DIAGNOSIS — G9341 Metabolic encephalopathy: Secondary | ICD-10-CM | POA: Diagnosis present

## 2022-10-22 DIAGNOSIS — E876 Hypokalemia: Secondary | ICD-10-CM | POA: Diagnosis present

## 2022-10-22 DIAGNOSIS — Z886 Allergy status to analgesic agent status: Secondary | ICD-10-CM

## 2022-10-22 DIAGNOSIS — T68XXXA Hypothermia, initial encounter: Secondary | ICD-10-CM

## 2022-10-22 DIAGNOSIS — Z79899 Other long term (current) drug therapy: Secondary | ICD-10-CM

## 2022-10-22 DIAGNOSIS — Z91040 Latex allergy status: Secondary | ICD-10-CM

## 2022-10-22 DIAGNOSIS — F0390 Unspecified dementia without behavioral disturbance: Secondary | ICD-10-CM | POA: Diagnosis present

## 2022-10-22 DIAGNOSIS — E11649 Type 2 diabetes mellitus with hypoglycemia without coma: Principal | ICD-10-CM | POA: Diagnosis present

## 2022-10-22 HISTORY — DX: Type 2 diabetes mellitus without complications: E11.9

## 2022-10-22 HISTORY — DX: Unspecified dementia, unspecified severity, without behavioral disturbance, psychotic disturbance, mood disturbance, and anxiety: F03.90

## 2022-10-22 LAB — CBC
HCT: 38.9 % — ABNORMAL LOW (ref 39.0–52.0)
Hemoglobin: 13.2 g/dL (ref 13.0–17.0)
MCH: 29.8 pg (ref 26.0–34.0)
MCHC: 33.9 g/dL (ref 30.0–36.0)
MCV: 87.8 fL (ref 80.0–100.0)
Platelets: 144 10*3/uL — ABNORMAL LOW (ref 150–400)
RBC: 4.43 MIL/uL (ref 4.22–5.81)
RDW: 13.4 % (ref 11.5–15.5)
WBC: 5.5 10*3/uL (ref 4.0–10.5)
nRBC: 0 % (ref 0.0–0.2)

## 2022-10-22 LAB — CBG MONITORING, ED
Glucose-Capillary: 68 mg/dL — ABNORMAL LOW (ref 70–99)
Glucose-Capillary: 104 mg/dL — ABNORMAL HIGH (ref 70–99)
Glucose-Capillary: 160 mg/dL — ABNORMAL HIGH (ref 70–99)
Glucose-Capillary: 140 mg/dL — ABNORMAL HIGH (ref 70–99)
Glucose-Capillary: 198 mg/dL — ABNORMAL HIGH (ref 70–99)

## 2022-10-22 LAB — BASIC METABOLIC PANEL
Anion gap: 13 (ref 5–15)
BUN: 13 mg/dL (ref 6–20)
CO2: 20 mmol/L — ABNORMAL LOW (ref 22–32)
Calcium: 8.9 mg/dL (ref 8.9–10.3)
Chloride: 104 mmol/L (ref 98–111)
Creatinine, Ser: 1.21 mg/dL (ref 0.61–1.24)
GFR, Estimated: >60 mL/min (ref >60)
Glucose, Bld: 163 mg/dL — ABNORMAL HIGH (ref 70–99)
Potassium: 3.3 mmol/L — ABNORMAL LOW (ref 3.5–5.1)
Sodium: 137 mmol/L (ref 135–145)

## 2022-10-22 LAB — SARS CORONAVIRUS 2 BY RT PCR: SARS Coronavirus 2 by RT PCR: NEGATIVE

## 2022-10-22 LAB — HEMOGLOBIN A1C
Hgb A1c MFr Bld: 10.4 % — ABNORMAL HIGH (ref 4.8–5.6)
Mean Plasma Glucose: 251.78 mg/dL

## 2022-10-22 LAB — T4, FREE: Free T4: 0.68 ng/dL (ref 0.61–1.12)

## 2022-10-22 LAB — MAGNESIUM: Magnesium: 2.1 mg/dL (ref 1.7–2.4)

## 2022-10-22 LAB — TSH: TSH: 1.515 u[IU]/mL (ref 0.350–4.500)

## 2022-10-22 MED ORDER — SODIUM CHLORIDE 0.9 % IV SOLN: 250.0000 mL | INTRAVENOUS | Status: DC | PRN

## 2022-10-22 MED ORDER — SODIUM CHLORIDE 0.9% FLUSH: 3.0000 mL | INTRAVENOUS | Status: DC | PRN

## 2022-10-22 MED ORDER — DEXTROSE 50 % IV SOLN: 1.0000 | INTRAVENOUS | Status: DC | PRN

## 2022-10-22 MED ORDER — MELATONIN 3 MG PO TABS
3.0000 mg | ORAL_TABLET | Freq: Every evening | ORAL | Status: DC | PRN
  Administered 2022-10-23: 3 mg via ORAL
  Filled 2022-10-22: qty 1

## 2022-10-22 MED ORDER — DEXTROSE 50 % IV SOLN: 1.0000 | Freq: Once | INTRAVENOUS | Status: AC

## 2022-10-22 MED ORDER — DEXTROSE 50 % IV SOLN: 1.0000 | Freq: Once | INTRAVENOUS | Status: DC

## 2022-10-22 MED ORDER — SODIUM CHLORIDE 0.9% FLUSH
3.0000 mL | Freq: Two times a day (BID) | INTRAVENOUS | Status: DC
  Administered 2022-10-22 – 2022-10-23 (×2): 3 mL via INTRAVENOUS

## 2022-10-22 MED ORDER — DEXTROSE 50 % IV SOLN
INTRAVENOUS | Status: AC
  Administered 2022-10-22: 50 mL via INTRAVENOUS
  Filled 2022-10-22: qty 50

## 2022-10-22 MED ORDER — DEXTROSE 10 % IV SOLN: INTRAVENOUS | Status: DC

## 2022-10-22 MED ORDER — ONDANSETRON HCL 4 MG/2ML IJ SOLN: 4.0000 mg | Freq: Four times a day (QID) | INTRAMUSCULAR | Status: DC | PRN

## 2022-10-22 MED ORDER — ACETAMINOPHEN 650 MG RE SUPP: 650.0000 mg | Freq: Four times a day (QID) | RECTAL | Status: DC | PRN

## 2022-10-22 MED ORDER — ACETAMINOPHEN 325 MG PO TABS: 650.0000 mg | ORAL_TABLET | Freq: Four times a day (QID) | ORAL | Status: DC | PRN

## 2022-10-22 MED ORDER — POTASSIUM CHLORIDE CRYS ER 20 MEQ PO TBCR
40.0000 meq | EXTENDED_RELEASE_TABLET | Freq: Once | ORAL | Status: AC
  Administered 2022-10-22: 40 meq via ORAL
  Filled 2022-10-22: qty 2

## 2022-10-22 NOTE — H&P (Signed)
History and Physical      Curtis Tanner:[id number] DOB: Jul 06, 1960 DOA: 10/22/2022; DOS: 10/22/2022  PCP: Toma Deiters, MD  Patient coming from: home   I have personally briefly reviewed patient's old medical records in [facility]  Chief Complaint: Altered mental status  HPI: Curtis Tanner is a 62 y.o. male with medical history significant for dementia, type 2 diabetes mellitus, who is admitted to [facility] on 10/22/2022 with hypoglycemia after presenting from home to [facility] ED complaining of altered mental status.   In the setting of the patient's dementia, he lives at home with his sister, who is in charge of providing the patient with his outpatient medications, including his basal and short acting insulin in the setting of underlying diabetes.  The patient was found to exhibit diminished responsiveness, confusion earlier today, prompting EMS to be called at which time EMS found the patient's blood sugar to be 32.  He received an amp of D50 and route to the ED, following which patient's blood sugar improved to 138, with noted improvement in mental status with this improving blood sugar. ***   ***       ***   ED Course:  Vital signs in the ED were notable for the following: ***  Labs were notable for the following: ***  Per my interpretation, EKG in ED demonstrated the following:  ***  Imaging in the ED, per corresponding formal radiology read, was notable for the following:  ***  While in the ED, the following were administered: ***  Subsequently, the patient was admitted  ***  ***red    Review of Systems: As per HPI otherwise 10 point review of systems negative.   Past Medical History:  Diagnosis Date   Dementia (HCC)    DM2 (diabetes mellitus, type 2) (HCC)     History reviewed. No pertinent surgical history.  Social History:  reports that he has never smoked. He does not have any smokeless tobacco history on file. He  reports that he does not currently use alcohol. He reports that he does not use drugs.   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 No pertinent family history.  Family history reviewed and not pertinent ***   Prior to Admission medications   Medication Sig Start Date End Date Taking? Authorizing Provider  HUMALOG 100 UNIT/ML injection Inject 25 Units into the skin 2 (two) times daily with a meal. 12/25/20   [provider]  insulin glargine (LANTUS) 100 UNIT/ML injection Inject 60 Units into the skin at bedtime. 11/30/05   [provider]  Multiple Vitamin (MULTIVITAMIN WITH MINERALS) TABS tablet Take 1 tablet by mouth daily. 01/02/21   Coralie Keens, MD     Objective    Physical Exam: Vitals:   10/22/22 2130 10/22/22 2227 10/22/22 2255 10/22/22 2315  BP: 136/89  132/79 117/77  Pulse: 63  66 63  Resp: 16  19 19   Temp:  (!) 93.8 F (34.3 C)    TempSrc:  Rectal    SpO2: 100%  100% 100%    General: appears to be stated age; alert, oriented Skin: warm, dry, no rash Head:  AT/[place] Mouth:  Oral mucosa membranes appear moist, normal dentition Neck: supple; trachea midline Heart:  RRR; did not appreciate any M/R/G Lungs: CTAB, did not appreciate any wheezes, rales,  or rhonchi Abdomen: + BS; soft, ND, NT Vascular: 2+ pedal pulses b/l; 2+ radial pulses b/l Extremities: no peripheral edema, no muscle wasting Neuro: strength and sensation intact in upper and lower extremities b/l ***   *** Neuro: 5/5 strength of the proximal and distal flexors and extensors of the upper and lower extremities bilaterally; sensation intact in upper and lower extremities b/l; cranial nerves II through XII grossly intact; no pronator drift; no evidence suggestive of slurred speech, dysarthria, or facial droop; Normal muscle tone. No tremors.  *** Neuro: In  the setting of the patient's current mental status and associated inability to follow instructions, unable to perform full neurologic exam at this time.  As such, assessment of strength, sensation, and cranial nerves is limited at this time. Patient noted to spontaneously move all 4 extremities. No tremors.  ***    Labs on Admission: I have personally reviewed following labs and imaging studies  CBC: Recent Labs  Lab 10/22/22 2148  WBC 5.5  HGB 13.2  HCT 38.9*  MCV 87.8  PLT 144*   Basic Metabolic Panel: Recent Labs  Lab 10/22/22 2148 10/22/22 2245  NA 137  --   K 3.3*  --   CL 104  --   CO2 20*  --   GLUCOSE 163*  --   BUN 13  --   CREATININE 1.21  --   CALCIUM 8.9  --   MG  --  2.1   GFR: CrCl cannot be calculated (Unknown ideal weight.). Liver Function Tests: No results for input(s): "AST", "ALT", "ALKPHOS", "BILITOT", "PROT", "ALBUMIN" in the last 168 hours. No results for input(s): "LIPASE", "AMYLASE" in the last 168 hours. No results for input(s): "AMMONIA" in the last 168 hours. Coagulation Profile: No results for input(s): "INR", "PROTIME" in the last 168 hours. Cardiac Enzymes: No results for input(s): "CKTOTAL", "CKMB", "CKMBINDEX", "TROPONINI" in the last 168 hours. BNP (last 3 results) No results for input(s): "PROBNP" in the last 8760 hours. HbA1C: Recent Labs    10/22/22 2148  HGBA1C 10.4*   CBG: Recent Labs  Lab 10/22/22 2128 10/22/22 2145 10/22/22 2201 10/22/22 2232 10/22/22 2258  GLUCAP 68* 160* 104* 140* 198*   Lipid Profile: No results for input(s): "CHOL", "HDL", "LDLCALC", "TRIG", "CHOLHDL", "LDLDIRECT" in the last 72 hours. Thyroid Function Tests: Recent Labs    10/22/22 2245  TSH 1.515  FREET4 0.68   Anemia Panel: No results for input(s): "VITAMINB12", "FOLATE", "FERRITIN", "TIBC", "IRON", "RETICCTPCT" in the last 72 hours. Urine analysis:    Component Value Date/Time   COLORURINE YELLOW 12/30/2020 2241   APPEARANCEUR  CLEAR 12/30/2020 2241   LABSPEC 1.025 12/30/2020 2241   PHURINE 5.0 12/30/2020 2241   GLUCOSEU >=500 (A) 12/30/2020 2241   HGBUR NEGATIVE 12/30/2020 2241   BILIRUBINUR NEGATIVE 12/30/2020 2241   KETONESUR NEGATIVE 12/30/2020 2241   PROTEINUR NEGATIVE 12/30/2020 2241   NITRITE NEGATIVE 12/30/2020 2241   LEUKOCYTESUR NEGATIVE 12/30/2020 2241    Radiological Exams on Admission: DG Chest Portable 1 View  Result Date: 10/22/2022 CLINICAL DATA:  Aspiration evaluation. EXAM: PORTABLE CHEST 1 VIEW COMPARISON:  December 30, 2020 FINDINGS: The heart size and mediastinal contours are within normal limits. Low lung volumes are noted. Mild atelectasis is seen within the retrocardiac region of the left lung base. There is mild, stable elevation of the right hemidiaphragm. No pleural effusion or pneumothorax is identified. Multilevel degenerative changes seen throughout the thoracic spine. IMPRESSION: Low lung volumes with mild left basilar atelectasis. Electronically  Signed   By: Aram Candela M.D.   On: 10/22/2022 22:02      Assessment/Plan   Principal Problem:   Hypoglycemia   ***            ***                ***                 ***               ***               ***               ***                ***               ***               ***               ***               ***              ***          ***  DVT prophylaxis: SCD's ***  Code Status: Full code*** Family Communication: none*** Disposition Plan: Per Rounding Team Consults called: none***;  Admission status: ***     I SPENT GREATER THAN 75 *** MINUTES IN CLINICAL CARE TIME/MEDICAL DECISION-MAKING IN COMPLETING THIS ADMISSION.      Chaney Born Maclaine Ahola DO [facility]  From 7PM - 7AM   10/22/2022, 11:58 PM   ***

## 2022-10-22 NOTE — ED Triage Notes (Signed)
Patient BIB [facility] from home for AMS/unresponsive. Family called EMS b/c pt was altered but they were unable to check his sugar. EMS initial CBG was 32, increased to 207 after administration of D10 and D50. EMS also administered 1mg  atropine d/t patient initial HR being 44, increased to 70 after atroopine and remained. Patient appears somnolent at this time but GCS is 14. Other VS stable, CBG 68 at this time.

## 2022-10-22 NOTE — ED Notes (Signed)
Patient given D50 per provider and also given sandwich, applesauce and soda to drink. Patient currently eating. Will continue to monitor CBG.

## 2022-10-22 NOTE — ED Notes (Signed)
ED TO INPATIENT HANDOFF REPORT  ED Nurse Name and Phone #: Adael Culbreath [phone]  S Name/Age/Gender Curtis Tanner 62 y.o. male Room/Bed: 025C/025C  Code Status   Code Status: Full Code  Home/SNF/Other Home Patient oriented to: self, place, and situation Is this baseline? Yes      Chief Complaint Hypoglycemia [E16.2]  Triage Note Patient BIB [facility] from home for AMS/unresponsive. Family called EMS b/c pt was altered but they were unable to check his sugar. EMS initial CBG was 32, increased to 207 after administration of D10 and D50. EMS also administered 1mg  atropine d/t patient initial HR being 44, increased to 70 after atroopine and remained. Patient appears somnolent at this time but GCS is 14. Other VS stable, CBG 68 at this time.    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  May admit patient to [facility] or [facility] if equivalent level of care is available:: No  Covid Evaluation: Asymptomatic - no recent exposure (last 10 days) testing not required  Diagnosis: Hypoglycemia [242204]  Admitting Physician: Angie Fava [[id number]]  Attending Physician: Angie Fava [[id number]]  Certification:: I certify this patient will need inpatient services for at least 2 midnights  Expected Medical Readiness: 10/24/2022          B Medical/Surgery History History reviewed. No pertinent past medical history. History reviewed. No pertinent surgical history.    A IV Location/Drains/Wounds Patient Lines/Drains/Airways Status     Active Line/Drains/Airways     Name Placement date Placement time Site Days   Peripheral IV 10/22/22 18 G Right Antecubital 10/22/22  2126  Antecubital  less than 1            Intake/Output Last 24 hours No intake or output data in the 24 hours ending 10/22/22 2332  Labs/Imaging Results for orders placed or performed during the hospital encounter of 10/22/22 (from the past 48 hour(s))  CBG monitoring, ED     Status: Abnormal   Collection Time: 10/22/22  9:28 PM  Result Value Ref Range   Glucose-Capillary 68 (L) 70 - 99 mg/dL    Comment: Glucose reference range applies only to samples taken after fasting for at least 8 hours.  CBG monitoring, ED (now and then every hour for 3 hours)     Status: Abnormal   Collection Time: 10/22/22  9:45 PM  Result Value Ref Range   Glucose-Capillary 160 (H) 70 - 99 mg/dL    Comment: Glucose reference range applies only to samples taken after fasting for at least 8 hours.  Basic metabolic panel     Status: Abnormal   Collection Time: 10/22/22  9:48 PM  Result Value Ref Range   Sodium 137 135 - 145 mmol/L   Potassium 3.3 (L) 3.5 - 5.1 mmol/L   Chloride 104 98 - 111 mmol/L   CO2 20 (L) 22 - 32 mmol/L   Glucose, Bld 163 (H) 70 - 99 mg/dL    Comment: Glucose reference range applies only to  samples taken after fasting for at least 8 hours.   BUN 13 6 - 20 mg/dL   Creatinine, Ser 1.61 0.61 - 1.24 mg/dL   Calcium 8.9 8.9 - 09.6 mg/dL   GFR, Estimated >04 >54 mL/min    Comment: (NOTE) Calculated using the CKD-EPI Creatinine Equation (2021)    Anion gap 13 5 - 15    Comment: Performed at [facility], [address] [place], [place] [address]  CBC     Status: Abnormal   Collection Time: 10/22/22  9:48 PM  Result Value Ref Range   WBC 5.5 4.0 - 10.5 K/uL   RBC 4.43 4.22 - 5.81 MIL/uL   Hemoglobin 13.2 13.0 - 17.0 g/dL   HCT 91.4 (L) 78.2 - 95.6 %   MCV 87.8 80.0  - 100.0 fL   MCH 29.8 26.0 - 34.0 pg   MCHC 33.9 30.0 - 36.0 g/dL   RDW 21.3 08.6 - 57.8 %   Platelets 144 (L) 150 - 400 K/uL   nRBC 0.0 0.0 - 0.2 %    Comment: Performed at [facility], [address], [place], [place] [address]  Hemoglobin A1c     Status: Abnormal   Collection Time: 10/22/22  9:48 PM  Result Value Ref Range   Hgb A1c MFr Bld 10.4 (H) 4.8 - 5.6 %    Comment: (NOTE) Pre diabetes:          5.7%-6.4%  Diabetes:              >6.4%  Glycemic control for   <7.0% adults with diabetes    Mean Plasma Glucose 251.78 mg/dL    Comment: Performed at [facility], [address], [place], [place] [address]  CBG monitoring, ED (now and then every hour for 3 hours)     Status: Abnormal   Collection Time: 10/22/22 10:01 PM  Result Value Ref Range   Glucose-Capillary 104 (H) 70 - 99 mg/dL    Comment: Glucose reference range applies only to samples taken after fasting for at least 8 hours.  CBG monitoring, ED (now and then every hour for 3 hours)     Status: Abnormal   Collection Time: 10/22/22 10:32 PM  Result Value Ref Range   Glucose-Capillary 140 (H) 70 - 99 mg/dL    Comment: Glucose reference range applies only to samples taken after fasting for at least 8 hours.  TSH     Status: None   Collection Time: 10/22/22 10:45 PM  Result Value Ref Range   TSH 1.515 0.350 - 4.500 uIU/mL    Comment: Performed by a 3rd Generation assay with a functional sensitivity of <=0.01 uIU/mL. Performed at [facility], [address], [place], [place] [address]   CBG monitoring, ED     Status: Abnormal   Collection Time: 10/22/22 10:58 PM  Result Value Ref Range   Glucose-Capillary 198 (H) 70 - 99 mg/dL    Comment: Glucose reference range applies only to samples taken after fasting for at least 8 hours.   DG Chest Portable 1 View  Result Date: 10/22/2022 CLINICAL DATA:  Aspiration evaluation. EXAM: PORTABLE CHEST 1 VIEW COMPARISON:  December 30, 2020 FINDINGS: The heart  size and mediastinal contours are within normal limits. Low lung volumes are noted. Mild atelectasis is seen within the retrocardiac region of the left lung base. There is mild, stable elevation of the right hemidiaphragm. No pleural effusion or pneumothorax is identified. Multilevel  degenerative changes seen throughout the thoracic spine. IMPRESSION: Low lung volumes with mild left basilar atelectasis. Electronically Signed   By: Aram Candela M.D.   On: 10/22/2022 22:02    Pending Labs Unresulted Labs (From admission, onward)     Start     Ordered   10/23/22 0500  CBC with Differential/Platelet  Tomorrow morning,   R        10/22/22 2329   10/23/22 0500  Comprehensive metabolic panel  Tomorrow morning,   R        10/22/22 2329   10/23/22 0500  Magnesium  Tomorrow morning,   R        10/22/22 2329   10/22/22 2329  Magnesium  Add-on,   AD        10/22/22 2329   10/22/22 2224  SARS Coronavirus 2 by RT PCR (hospital order, performed in [facility] hospital lab) *cepheid single result test* Anterior Nasal Swab  (Tier 2 - SARS Coronavirus 2 by RT PCR (hospital order, performed in [facility] hospital lab) *cepheid single result test*)  Once,   URGENT        10/22/22 2223   10/22/22 2223  T4, free  Once,   URGENT        10/22/22 2222   10/22/22 2223  Urinalysis, Routine w reflex microscopic -Urine, Clean Catch  Once,   URGENT       Question:  Specimen Source  Answer:  Urine, Clean Catch   10/22/22 2223            Vitals/Pain Today's Vitals   10/22/22 2130 10/22/22 2227 10/22/22 2255 10/22/22 2315  BP: 136/89  132/79 117/77  Pulse: 63  66 63  Resp: 16  19 19   Temp:  (!) 93.8 F (34.3 C)    TempSrc:  Rectal    SpO2: 100%  100% 100%  PainSc: 0-No pain       Isolation Precautions No active isolations  Medications Medications  sodium chloride flush (NS) 0.9 % injection 3 mL (3 mLs Intravenous Given 10/22/22 2147)  sodium chloride flush (NS) 0.9 % injection 3 mL (has no  administration in time range)  0.9 %  sodium chloride infusion (has no administration in time range)  dextrose 10 % infusion ( Intravenous New Bag/Given 10/22/22 2215)  acetaminophen (TYLENOL) tablet 650 mg (has no administration in time range)    Or  acetaminophen (TYLENOL) suppository 650 mg (has no administration in time range)  melatonin tablet 3 mg (has no administration in time range)  ondansetron (ZOFRAN) injection 4 mg (has no administration in time range)  dextrose 50 % solution 50 mL (has no administration in time range)  dextrose 50 % solution 50 mL (50 mLs Intravenous Given 10/22/22 2135)    Mobility walks     Focused Assessments     R Recommendations: See Admitting Provider Note  Report given to:   Additional Notes:

## 2022-10-22 NOTE — ED Provider Notes (Addendum)
[place] EMERGENCY DEPARTMENT AT [facility] Provider Note   CSN: [id number] Arrival date & time: 10/22/22  2122     History  Chief Complaint  Patient presents with   Hypoglycemia   Bradycardia    Curtis Tanner Danger is a 62 y.o. male with a history of type 2 diabetes on insulin, presented to ED from home with concern for hypoglycemia and unresponsiveness.  EMS reports that they were called at the scene as the patient was altered.  They reported blood sugar was initially 32.  Patient was given D10 as well as D50 with improvement of blood sugar and mental status, and subsequently 1 mg of atropine was given for heart rate of 44.  The patient is slow to speak on arrival.  He reports that he did eat breakfast today.  He says he may have given himself insulin but he is not certain.  He says typically his family gives him insulin.  [facility] reports that there has been a lot of shuffling of family members caring for the patient at home, and that the typical person who cares for him was not present today.  No uncertain how much he was given.  His sister Curtis Tanner reports that she is his primary caretaker and she administers his insulin at home, and he does not give it to himself because he has dementia.  She reports to me that she gives the patient insulin 3 times a day, which is 20 units of insulin.  She reports she gives him the Humalog 20 units in the morning and afternoon and 20 units of Lantus at night.  She reports he did give him his insulin today and the patient did eat at home today.  HPI     Home Medications Prior to Admission medications   Medication Sig Start Date End Date Taking? Authorizing Provider  HUMALOG 100 UNIT/ML injection Inject 25 Units into the skin 2 (two) times daily with a meal. 12/25/20   [provider]  insulin glargine (LANTUS) 100 UNIT/ML injection Inject 60 Units into the skin at bedtime. 11/30/05   [provider]  Multiple  Vitamin (MULTIVITAMIN WITH MINERALS) TABS tablet Take 1 tablet by mouth daily. 01/02/21   Arrien, York Ram, MD      Allergies    Ibuprofen, Latex, and Heparin (bovine)    Review of Systems   Review of Systems  Physical Exam Updated Vital Signs BP 132/79   Pulse 66   Temp (!) 93.8 F (34.3 C) (Rectal)   Resp 19   SpO2 100%  Physical Exam  ED Results / Procedures / Treatments   Labs (all labs ordered are listed, but only abnormal results are displayed) Labs Reviewed  BASIC METABOLIC PANEL - Abnormal; Notable for the following components:      Result Value   Potassium 3.3 (*)    CO2 20 (*)    Glucose, Bld 163 (*)    All other components within normal limits  CBC - Abnormal; Notable for the following components:   HCT 38.9 (*)    Platelets 144 (*)    All other components within normal limits  HEMOGLOBIN A1C - Abnormal; Notable for the following components:   Hgb A1c MFr Bld 10.4 (*)    All other components within normal limits  CBG MONITORING, ED - Abnormal; Notable for the following components:   Glucose-Capillary 68 (*)    All other components within normal limits  CBG MONITORING, ED - Abnormal;  Notable for the following components:   Glucose-Capillary 160 (*)    All other components within normal limits  CBG MONITORING, ED - Abnormal; Notable for the following components:   Glucose-Capillary 104 (*)    All other components within normal limits  CBG MONITORING, ED - Abnormal; Notable for the following components:   Glucose-Capillary 140 (*)    All other components within normal limits  CBG MONITORING, ED - Abnormal; Notable for the following components:   Glucose-Capillary 198 (*)    All other components within normal limits  SARS CORONAVIRUS 2 BY RT PCR  TSH  T4, FREE  URINALYSIS, ROUTINE W REFLEX MICROSCOPIC    EKG None  Radiology DG Chest Portable 1 View  Result Date: 10/22/2022 CLINICAL DATA:  Aspiration evaluation. EXAM: PORTABLE CHEST 1 VIEW  COMPARISON:  December 30, 2020 FINDINGS: The heart size and mediastinal contours are within normal limits. Low lung volumes are noted. Mild atelectasis is seen within the retrocardiac region of the left lung base. There is mild, stable elevation of the right hemidiaphragm. No pleural effusion or pneumothorax is identified. Multilevel degenerative changes seen throughout the thoracic spine. IMPRESSION: Low lung volumes with mild left basilar atelectasis. Electronically Signed   By: Aram Candela M.D.   On: 10/22/2022 22:02    Procedures .Critical Care  Performed by: Terald Sleeper, MD Authorized by: Terald Sleeper, MD   Critical care provider statement:    Critical care time (minutes):  40   Critical care time was exclusive of:  Separately billable procedures and treating other patients   Critical care was necessary to treat or prevent imminent or life-threatening deterioration of the following conditions:  Metabolic crisis   Critical care was time spent personally by me on the following activities:  Ordering and performing treatments and interventions, ordering and review of laboratory studies, ordering and review of radiographic studies, pulse oximetry, review of old charts, examination of patient and evaluation of patient's response to treatment   Care discussed with: admitting provider   Comments:     Hypoglycemia management     Medications Ordered in ED Medications  sodium chloride flush (NS) 0.9 % injection 3 mL (3 mLs Intravenous Given 10/22/22 2147)  sodium chloride flush (NS) 0.9 % injection 3 mL (has no administration in time range)  0.9 %  sodium chloride infusion (has no administration in time range)  dextrose 10 % infusion ( Intravenous New Bag/Given 10/22/22 2215)  dextrose 50 % solution 50 mL (50 mLs Intravenous Given 10/22/22 2135)    ED Course/ Medical Decision Making/ A&P Clinical Course as of 10/22/22 2313  Mon Oct 22, 2022  2222 Rectal temp 93.40F per RN -  ordered baer hugger.  D10 also ordered. [MT]  2309 Admitted to Dr Ralph Leyden hospitalist [MT]    Clinical Course User Index [MT] Renaye Rakers Kermit Balo, MD                                 Medical Decision Making Amount and/or Complexity of Data Reviewed Labs: ordered. Radiology: ordered.  Risk Prescription drug management.   This patient presents to the ED with concern for hypoglycemia. This involves an extensive number of treatment options, and is a complaint that carries with it a high risk of complications and morbidity.  The differential diagnosis includes infection versus iatrogenic hypoglycemia from insulin misuse versus other  Co-morbidities that complicate the patient evaluation: History of diabetes  on insulin a higher risk of metabolic derangement  Additional history obtained from EMS and the patient's sister by phone  I ordered and personally interpreted labs.  The pertinent results include: Blood sugars have been stabilized and above 100 throughout the patient stay in the ED, began dropping close to 100.  I ordered imaging studies including x-ray of the chest I independently visualized and interpreted imaging which showed no emergent findings I agree with the radiologist interpretation  The patient was maintained on a cardiac monitor.  I personally viewed and interpreted the cardiac monitored which showed an underlying rhythm of: Sinus rhythm borderline bradycardia   I ordered medication including D10 infusion for hypoglycemia treatment and monitoring  I have reviewed the patients home medicines and have made adjustments as needed   After the interventions noted above, I reevaluated the patient and found that they have: improved   Dispostion:  After consideration of the diagnostic results and the patients response to treatment, I feel that the patent would benefit from medical admission.  Patient was placed on an active bear hugger warmer for his hypothermia, which may  be related to the hypoglycemia.  We will check thyroid studies as well.  There is no evidence or indication of sepsis at this time and no localizing infectious symptoms.  We are awaiting a UA.  The patient was given food and blood sugar appears to have stabilized although he is on a D10 infusion at the moment.  He was admitted to the hospitalist in stable condition.         Final Clinical Impression(s) / ED Diagnoses Final diagnoses:  Hypoglycemia  Hypothermia, initial encounter    Rx / DC Orders ED Discharge Orders     None         Mililani Murthy, Kermit Balo, MD 10/22/22 2313    Terald Sleeper, MD 10/22/22 [phone]

## 2022-10-23 ENCOUNTER — Telehealth (HOSPITAL_COMMUNITY): Payer: Self-pay | Admitting: Pharmacy Technician

## 2022-10-23 ENCOUNTER — Other Ambulatory Visit (HOSPITAL_COMMUNITY): Payer: Self-pay

## 2022-10-23 DIAGNOSIS — E162 Hypoglycemia, unspecified: Secondary | ICD-10-CM | POA: Diagnosis not present

## 2022-10-23 DIAGNOSIS — E876 Hypokalemia: Secondary | ICD-10-CM | POA: Diagnosis present

## 2022-10-23 DIAGNOSIS — G9341 Metabolic encephalopathy: Secondary | ICD-10-CM | POA: Diagnosis present

## 2022-10-23 DIAGNOSIS — T68XXXA Hypothermia, initial encounter: Secondary | ICD-10-CM | POA: Diagnosis present

## 2022-10-23 LAB — CBC WITH DIFFERENTIAL/PLATELET
Abs Immature Granulocytes: 0.01 10*3/uL (ref 0.00–0.07)
Basophils Absolute: 0 10*3/uL (ref 0.0–0.1)
Basophils Relative: 1 %
Eosinophils Absolute: 0.1 10*3/uL (ref 0.0–0.5)
Eosinophils Relative: 1 %
HCT: 36.8 % — ABNORMAL LOW (ref 39.0–52.0)
Hemoglobin: 12.8 g/dL — ABNORMAL LOW (ref 13.0–17.0)
Immature Granulocytes: 0 %
Lymphocytes Relative: 28 %
Lymphs Abs: 1.2 10*3/uL (ref 0.7–4.0)
MCH: 29.8 pg (ref 26.0–34.0)
MCHC: 34.8 g/dL (ref 30.0–36.0)
MCV: 85.8 fL (ref 80.0–100.0)
Monocytes Absolute: 0.5 10*3/uL (ref 0.1–1.0)
Monocytes Relative: 11 %
Neutro Abs: 2.5 10*3/uL (ref 1.7–7.7)
Neutrophils Relative %: 59 %
Platelets: 144 10*3/uL — ABNORMAL LOW (ref 150–400)
RBC: 4.29 MIL/uL (ref 4.22–5.81)
RDW: 13.4 % (ref 11.5–15.5)
WBC: 4.3 10*3/uL (ref 4.0–10.5)
nRBC: 0 % (ref 0.0–0.2)

## 2022-10-23 LAB — COMPREHENSIVE METABOLIC PANEL
ALT: 17 U/L (ref 0–44)
AST: 17 U/L (ref 15–41)
Albumin: 3 g/dL — ABNORMAL LOW (ref 3.5–5.0)
Alkaline Phosphatase: 63 U/L (ref 38–126)
Anion gap: 4 — ABNORMAL LOW (ref 5–15)
BUN: 13 mg/dL (ref 6–20)
CO2: 26 mmol/L (ref 22–32)
Calcium: 8.7 mg/dL — ABNORMAL LOW (ref 8.9–10.3)
Chloride: 106 mmol/L (ref 98–111)
Creatinine, Ser: 1.28 mg/dL — ABNORMAL HIGH (ref 0.61–1.24)
GFR, Estimated: >60 mL/min (ref >60)
Glucose, Bld: 301 mg/dL — ABNORMAL HIGH (ref 70–99)
Potassium: 4 mmol/L (ref 3.5–5.1)
Sodium: 136 mmol/L (ref 135–145)
Total Bilirubin: 0.4 mg/dL (ref 0.3–1.2)
Total Protein: 5.9 g/dL — ABNORMAL LOW (ref 6.5–8.1)

## 2022-10-23 LAB — PROTIME-INR
INR: 1.1 (ref 0.8–1.2)
Prothrombin Time: 14.5 seconds (ref 11.4–15.2)

## 2022-10-23 LAB — GLUCOSE, CAPILLARY
Glucose-Capillary: 301 mg/dL — ABNORMAL HIGH (ref 70–99)
Glucose-Capillary: 340 mg/dL — ABNORMAL HIGH (ref 70–99)
Glucose-Capillary: 280 mg/dL — ABNORMAL HIGH (ref 70–99)
Glucose-Capillary: 261 mg/dL — ABNORMAL HIGH (ref 70–99)
Glucose-Capillary: 249 mg/dL — ABNORMAL HIGH (ref 70–99)
Glucose-Capillary: 290 mg/dL — ABNORMAL HIGH (ref 70–99)
Glucose-Capillary: 194 mg/dL — ABNORMAL HIGH (ref 70–99)
Glucose-Capillary: 249 mg/dL — ABNORMAL HIGH (ref 70–99)

## 2022-10-23 LAB — MAGNESIUM: Magnesium: 1.8 mg/dL (ref 1.7–2.4)

## 2022-10-23 LAB — VITAMIN B12: Vitamin B-12: 332 pg/mL (ref 180–914)

## 2022-10-23 LAB — PROCALCITONIN: Procalcitonin: <0.1 ng/mL

## 2022-10-23 LAB — CBG MONITORING, ED: Glucose-Capillary: 291 mg/dL — ABNORMAL HIGH (ref 70–99)

## 2022-10-23 MED ORDER — INSULIN ASPART 100 UNIT/ML IJ SOLN
0.0000 [IU] | Freq: Three times a day (TID) | INTRAMUSCULAR | Status: DC
  Administered 2022-10-23: 5 [IU] via SUBCUTANEOUS
  Administered 2022-10-23: 3 [IU] via SUBCUTANEOUS
  Administered 2022-10-23 – 2022-10-24 (×2): 5 [IU] via SUBCUTANEOUS
  Administered 2022-10-24: 7 [IU] via SUBCUTANEOUS

## 2022-10-23 MED ORDER — INSULIN GLARGINE-YFGN 100 UNIT/ML ~~LOC~~ SOLN
15.0000 [IU] | Freq: Every day | SUBCUTANEOUS | Status: DC
  Administered 2022-10-23: 15 [IU] via SUBCUTANEOUS
  Filled 2022-10-23 (×2): qty 0.15

## 2022-10-23 MED ORDER — INSULIN ASPART 100 UNIT/ML IJ SOLN
0.0000 [IU] | Freq: Every day | INTRAMUSCULAR | Status: DC
  Administered 2022-10-23: 2 [IU] via SUBCUTANEOUS

## 2022-10-23 NOTE — TOC Benefit Eligibility Note (Signed)
Patient Product/process development scientist completed.    The patient is insured through [facility].     Ran test claim for [employer] and Requires Prior Authorization  Ran test claim for [employer] and Requires Prior Authorization  This test claim was processed through [employer]- copay amounts may vary at other pharmacies due to [employer], or as the patient moves through the different stages of their insurance plan.     Roland Earl, CPHT Pharmacy Technician III Certified Patient Advocate [facility] Patient Advocate Team Direct Number: [phone]  Fax: [phone]

## 2022-10-23 NOTE — Plan of Care (Signed)

## 2022-10-23 NOTE — Inpatient Diabetes Management (Addendum)
Inpatient Diabetes Program Recommendations  AACE/ADA: New Consensus Statement on Inpatient Glycemic Control  Target Ranges:  Prepandial:   less than 140 mg/dL      Peak postprandial:   less than 180 mg/dL (1-2 hours)      Critically ill patients:  140 - 180 mg/dL    Latest Reference Range & Units 10/23/22 00:02 10/23/22 01:00 10/23/22 02:14 10/23/22 05:12 10/23/22 07:28  Glucose-Capillary 70 - 99 mg/dL 756 (H) 433 (H) 295 (H) 280 (H) 261 (H)    Latest Reference Range & Units 10/22/22 21:28 10/22/22 21:45 10/22/22 22:01 10/22/22 22:32 10/22/22 22:58  Glucose-Capillary 70 - 99 mg/dL 68 (L) 188 (H) 416 (H) 140 (H) 198 (H)    Latest Reference Range & Units 10/22/22 21:48  Hemoglobin A1C 4.8 - 5.6 % 10.4 (H)   Review of Glycemic Control  Diabetes history: DM2 Outpatient Diabetes medications: Jardiance 25 mg daily, Glipizide 5 mg BID, Humalog 20 unit BID, Lantus 20 units QHS Current orders for Inpatient glycemic control: Novolog 0-9 units TID with meals, Novolog 0-5 units QHS  Inpatient Diabetes Program Recommendations:    Insulin: Noted patient initially hypoglycemic which has resolved. Please consider ordering Semglee 15 units Q24H.  Outpatient: At time of discharge, consider prescribing Baqsimi (nasal glucagon), FreeStyle Libre3 sensors [id number]), FreeStyle Libre3 reader [phone]).   NOTE: Noted consult for inpatient diabetes coordinator. Chart reviewed. Per H&P, patient has hx of dementia and lives with his sister who provides patient with all outpatient medications. Will plan to call patient's sister.   Addendum 10/23/22@11 :15-Spoke with patient at bedside. Patient confirms that his sister checks his glucose several times a day and that she gives him all his medications. Patient states he can not tell if his glucose is low or not. Informed patient I would call his sister to discuss his DM control. Called patient's sister Tallie Maritato. Ms. Creager states that she checks her brother's  glucose 3 times a day and that she gives him his DM medications. She reports that his glucose can range from 70's-HI on glucometer. She states she gives him Lantus 10-20 units QPM, Jardiance 25 mg daily (just recently started this medication), and Glipizide 5 mg BID. She confirms that he has Humalog at home but she states she does not give it to him because when she gives him the prescribed 20 units BID his glucose drops too low.   She reports that patient sneaks and eats food that is high in sugar at home and that he also goes to nearby stores and asks people for money and buy sweets with the money.  Discussed Lantus and Humalog insulin and how each works. Discussed that he may need to use a Humalog correction scale versus a set dose of Humalog if he experiences hypoglycemia following the 20 units. She confirms that patient can not tell if his sugar is low. Discussed FreeStyle Libre 3 CGM sensor, how it works, discussed alarms, and she states she would like for patient to use the sensor if possible. Informed her that I would ask attending provider for an order to provide a sample sensor. She states that she could meet inpatient diabetes coordinator at hospital on Wednesday at 9am for education on the [facility] sensor. Discussed hypoglycemia in more detail. Patient's sister treats patient's hypoglycemia with peanut butter; she notes sometimes patient can not swallow well when glucose is low. Encouraged her to treat hypoglycemia with 15 grams of fast acting sugar source such as 4oz juice or reg soda  or glucose tablets. Discussed Baqsimi (nasal glucagon) and how it works. Patient's sister states she would love to have Baqsimi to use at home when patient can not swallow well.  Informed patient's sister that it would be requested that Baqsimi be prescribed at discharge. Ms. Rapp verbalized understanding of information and has no questions at this time.  Thanks, Orlando Penner, RN, MSN, CDCES Diabetes  Coordinator Inpatient Diabetes Program [phone] (Team Pager from 8am to 5pm)

## 2022-10-23 NOTE — Progress Notes (Addendum)
PROGRESS NOTE    Curtis Tanner  WUJ:[id number] DOB: 08-25-1960 DOA: 10/22/2022 PCP: Toma Deiters, MD   Brief Narrative:  62 year old male with history of dementia, diabetes mellitus type 2 presented with altered mental status and hypoglycemia.  EMS found him to have blood sugar of 32 for which she received an amp of D50; blood sugar improved to 138.  On presentation, blood work was mostly unremarkable; initial CBG in the ED was 68 which improved to 160 after an amp of D50.  He was started on D10 drip.  COVID-19 testing was negative.  Assessment & Plan:   Symptomatic hypoglycemia -  EMS found him to have blood sugar of 32 for which she received an amp of D50; blood sugar improved to 138.  On presentation, blood work was mostly unremarkable; initial CBG in the ED was 68 which improved to 160 after an amp of D50.  He was started on D10 drip. -Blood sugars currently on the higher side and D10 drip has already been discontinued. -Glipizide on hold.  Acute metabolic encephalopathy History of dementia -Patient was more confused on presentation probably due to hypoglycemia.  Mental status improving and possibly back to baseline currently. -Fall precautions.  PT eval.  Monitor mental status.  Diabetes mellitus type 2 uncontrolled with hyperglycemia -A1c 10.4.  Jardiance, glipizide and long-acting insulin on hold.  Apparently on Lantus 20 units nightly. -Blood sugars have now trending upwards.  Diabetes coordinator to consult. -Start CBGs with SSI.  Add long-acting insulin at a lower dose.  Hypokalemia -Resolved  Thrombocytopenia -Questionable cause.  No signs of bleeding.  Monitor intermittently.    DVT prophylaxis: SCDs Code Status: Full Family Communication: None at bedside Disposition Plan: Status is: Inpatient Remains inpatient appropriate because: Of severity of illness  Consultants: None  Procedures: None  Antimicrobials: None   Subjective: Patient seen and  examined at bedside.  Awake, slow to respond, confused, poor historian.  No seizures, vomiting, agitation reported.  Objective: Vitals:   10/23/22 0250 10/23/22 0400 10/23/22 0500 10/23/22 0731  BP:    (!) 140/78  Pulse:    (!) 54  Resp:  16  16  Temp: (!) 97.2 F (36.2 C) 97.7 F (36.5 C)  97.8 F (36.6 C)  TempSrc: Oral Oral  Oral  SpO2:    100%  Weight:   79.5 kg   Height:        Intake/Output Summary (Last 24 hours) at 10/23/2022 1044 Last data filed at 10/23/2022 0400 Gross per 24 hour  Intake 223.49 ml  Output --  Net 223.49 ml   Filed Weights   10/23/22 0100 10/23/22 0500  Weight: 80.4 kg 79.5 kg    Examination:  General exam: Appears calm and comfortable.  On room air. Respiratory system: Bilateral decreased breath sounds at bases Cardiovascular system: S1 & S2 heard, Rate controlled Gastrointestinal system: Abdomen is nondistended, soft and nontender. Normal bowel sounds heard. Extremities: No cyanosis, clubbing, edema  Central nervous system: Awake, pleasantly confused.  No focal neurological deficits. Moving extremities Skin: No rashes, lesions or ulcers Psychiatry: Flat affect.  Not agitated.   Data Reviewed: I have personally reviewed following labs and imaging studies  CBC: Recent Labs  Lab 10/22/22 2148 10/23/22 0516  WBC 5.5 4.3  NEUTROABS  --  2.5  HGB 13.2 12.8*  HCT 38.9* 36.8*  MCV 87.8 85.8  PLT 144* 144*   Basic Metabolic Panel: Recent Labs  Lab 10/22/22 2148 10/22/22 2245 10/23/22 0516  NA 137  --  136  K 3.3*  --  4.0  CL 104  --  106  CO2 20*  --  26  GLUCOSE 163*  --  301*  BUN 13  --  13  CREATININE 1.21  --  1.28*  CALCIUM 8.9  --  8.7*  MG  --  2.1 1.8   GFR: Estimated Creatinine Clearance: 58.4 mL/min (A) (by C-G formula based on SCr of 1.28 mg/dL (H)). Liver Function Tests: Recent Labs  Lab 10/23/22 0516  AST 17  ALT 17  ALKPHOS 63  BILITOT 0.4  PROT 5.9*  ALBUMIN 3.0*   No results for input(s):  "LIPASE", "AMYLASE" in the last 168 hours. No results for input(s): "AMMONIA" in the last 168 hours. Coagulation Profile: Recent Labs  Lab 10/23/22 0516  INR 1.1   Cardiac Enzymes: No results for input(s): "CKTOTAL", "CKMB", "CKMBINDEX", "TROPONINI" in the last 168 hours. BNP (last 3 results) No results for input(s): "PROBNP" in the last 8760 hours. HbA1C: Recent Labs    10/22/22 2148  HGBA1C 10.4*   CBG: Recent Labs  Lab 10/23/22 0002 10/23/22 0100 10/23/22 0214 10/23/22 0512 10/23/22 0728  GLUCAP 291* 301* 340* 280* 261*   Lipid Profile: No results for input(s): "CHOL", "HDL", "LDLCALC", "TRIG", "CHOLHDL", "LDLDIRECT" in the last 72 hours. Thyroid Function Tests: Recent Labs    10/22/22 2245  TSH 1.515  FREET4 0.68   Anemia Panel: Recent Labs    10/23/22 0516  VITAMINB12 332   Sepsis Labs: Recent Labs  Lab 10/23/22 0516  PROCALCITON <0.10    Recent Results (from the past 240 hour(s))  SARS Coronavirus 2 by RT PCR (hospital order, performed in [facility] hospital lab) *cepheid single result test* Anterior Nasal Swab     Status: None   Collection Time: 10/22/22 11:01 PM   Specimen: Anterior Nasal Swab  Result Value Ref Range Status   SARS Coronavirus 2 by RT PCR NEGATIVE NEGATIVE Final    Comment: Performed at [facility] Lab, [address], [place], [place] [address]         Radiology Studies: DG Chest Portable 1 View  Result Date: 10/22/2022 CLINICAL DATA:  Aspiration evaluation. EXAM: PORTABLE CHEST 1 VIEW COMPARISON:  December 30, 2020 FINDINGS: The heart size and mediastinal contours are within normal limits. Low lung volumes are noted. Mild atelectasis is seen within the retrocardiac region of the left lung base. There is mild, stable elevation of the right hemidiaphragm. No pleural effusion or pneumothorax is identified. Multilevel degenerative changes seen throughout the thoracic spine. IMPRESSION: Low lung volumes with mild left basilar  atelectasis. Electronically Signed   By: Aram Candela M.D.   On: 10/22/2022 22:02        Scheduled Meds:  insulin aspart  0-5 Units Subcutaneous QHS   insulin aspart  0-9 Units Subcutaneous TID WC   sodium chloride flush  3 mL Intravenous Q12H   Continuous Infusions:  sodium chloride            Glade Lloyd, MD [facility] 10/23/2022, 10:44 AM

## 2022-10-23 NOTE — TOC Initial Note (Signed)
Transition of Care [facility]) - Initial/Assessment Note    Patient Details  Name: Curtis Tanner MRN: [id number] Date of Birth: [birth year]  [facility] CM/SW Contact:    Gala Lewandowsky, RN Phone Number: 10/23/2022, 11:19 AM  Clinical Narrative:  Patient presented for altered mental status and hypoglycemia. PTA patient was from home with sister Myriam Jacobson. Case Manager spoke with Myriam Jacobson and she is the caregiver for the patient. Myriam Jacobson works third shift and and when she works, the patient stays with his uncle. Per sister Myriam Jacobson, patient cannot be left alone at anytime. Myriam Jacobson states the patient is independent in the home; however has dme rolling walker. She is speaking with DSS in [facility] for CAPs hours for aide services and Case Manager discussed PACE as an option as well. Sister states patient was recently at a memory care unit and they let him go about 2 months ago. Case Manager will continue to follow for transition of care needs as the patient progresses.                   Expected Discharge Plan: Home/Self Care Barriers to Discharge: Continued Medical Work up  Expected Discharge Plan and Services In-house Referral: NA Discharge Planning Services: CM Consult Post Acute Care Choice: NA Living arrangements for the past 2 months: Single Family Home                   DME Agency: NA  Prior Living Arrangements/Services Living arrangements for the past 2 months: Single Family Home Lives with:: Siblings Patient language and need for interpreter reviewed:: Yes Do you feel safe going back to the place where you live?: Yes      Need for Family Participation in Patient Care: Yes (Comment)   Current home services: DME (rolling walker) Criminal Activity/Legal Involvement Pertinent to Current Situation/Hospitalization: No - Comment as needed  Activities of Daily Living Home Assistive Devices/Equipment: Environmental consultant (specify type) (rolater) ADL Screening (condition at  time of admission) Patient's cognitive ability adequate to safely complete daily activities?: Yes Is the patient deaf or have difficulty hearing?: No Does the patient have difficulty seeing, even when wearing glasses/contacts?: No Does the patient have difficulty concentrating, remembering, or making decisions?: Yes Patient able to express need for assistance with ADLs?: Yes Does the patient have difficulty dressing or bathing?: No Independently performs ADLs?: Yes (appropriate for developmental age) Communication: Needs assistance Is this a change from baseline?: Pre-admission baseline Dressing (OT): Independent Feeding: Independent Bathing: Independent Toileting: Independent In/Out Bed: Independent Walks in Home: Independent with device (comment) Does the patient have difficulty walking or climbing stairs?: No Weakness of Legs: None Weakness of Arms/Hands: None  Permission Sought/Granted Permission sought to share information with : Family Supports, Magazine features editor, Case Manager    Emotional Assessment Appearance:: Appears stated age Attitude/Demeanor/Rapport: Unable to Assess Affect (typically observed): Unable to Assess   Alcohol / Substance Use: Not Applicable Psych Involvement: No (comment)  Admission diagnosis:  Hypoglycemia [E16.2] Hypothermia, initial encounter [T68.XXXA] Patient Active Problem List   Diagnosis Date Noted   Hypothermia 10/23/2022   Acute metabolic encephalopathy 10/23/2022   Hypokalemia 10/23/2022   Hypoglycemia 10/22/2022   Acute encephalopathy 12/31/2020   DM2 (diabetes mellitus, type 2) (HCC) 12/31/2020   HTN (hypertension) 12/31/2020   GERD (gastroesophageal reflux disease) 12/31/2020   AKI (acute kidney injury) (HCC) 12/31/2020   Bradycardia 12/31/2020   Altered mental status 12/31/2020   PCP:  Toma Deiters, MD Pharmacy:  [facility] [phone] - [place], [place] - [address] [place]  [address] Phone: [phone] Fax: [phone]  Social Determinants of Health (SDOH) Social History: SDOH Screenings   Food Insecurity: Food Insecurity Present (10/23/2022)  Housing: Low Risk  (10/23/2022)  Transportation Needs: No Transportation Needs (10/23/2022)  Utilities: Not At Risk (10/23/2022)  Financial Resource Strain: Low Risk  (08/15/2021)   Received from [facility]  Tobacco Use: Unknown (10/22/2022)   Readmission Risk Interventions     No data to display

## 2022-10-23 NOTE — Progress Notes (Signed)
Mobility Specialist Progress Note:    10/23/22 1623  Mobility  Activity Ambulated with assistance in hallway  Level of Assistance Contact guard assist, steadying assist  Assistive Device None  Distance Ambulated (ft) 700 ft  Activity Response Tolerated well  Mobility Referral Yes  $Mobility charge 1 Mobility  Mobility Specialist Start Time (ACUTE ONLY) 1547  Mobility Specialist Stop Time (ACUTE ONLY) 1600  Mobility Specialist Time Calculation (min) (ACUTE ONLY) 13 min   Received pt in bed having no complaints and requests to go on a walk through the halls. Pt was asymptomatic throughout ambulation and returned to room w/o fault. Left in bed w/ call bell in reach and all needs met. Bed alarm on.   Thompson Grayer Mobility Specialist  Please contact vis Secure Chat or  Rehab Office [phone]

## 2022-10-23 NOTE — Evaluation (Signed)
Physical Therapy Evaluation Patient Details Name: Curtis Tanner MRN: [id number] DOB: [birth year] Today's Date: 10/23/2022  History of Present Illness  62 year old male admitted 8/26  with altered mental status and hypoglycemia.  EMS found him to have blood sugar of 32.COVID-19 testing was negative. PMH: dementia, diabetes mellitus type 2  Clinical Impression  Pt admitted with above diagnosis. Pt is most likely close to baseline and was able to walk without device in halls with contact guard assist.  Pt should progress well and want to see a few times to ensure pt is at baseline.  Pt currently with functional limitations due to the deficits listed below (see PT Problem List). Pt will benefit from acute skilled PT to increase their independence and safety with mobility to allow discharge.           If plan is discharge home, recommend the following: Help with stairs or ramp for entrance   Can travel by private vehicle        Equipment Recommendations None recommended by PT  Recommendations for Other Services       Functional Status Assessment Patient has had a recent decline in their functional status and demonstrates the ability to make significant improvements in function in a reasonable and predictable amount of time.     Precautions / Restrictions Precautions Precautions: Fall Restrictions Weight Bearing Restrictions: No      Mobility  Bed Mobility Overal bed mobility: Independent                  Transfers Overall transfer level: Independent                      Ambulation/Gait Ambulation/Gait assistance: Contact guard assist Gait Distance (Feet): 300 Feet Assistive device: None Gait Pattern/deviations: Step-through pattern, Decreased stride length       General Gait Details: No LOB with min challenges. Pt appears at baseline.  Stairs            Wheelchair Mobility     Tilt Bed    Modified Rankin (Stroke Patients Only)        Balance Overall balance assessment: Needs assistance Sitting-balance support: No upper extremity supported, Feet supported Sitting balance-Leahy Scale: Fair     Standing balance support: No upper extremity supported, During functional activity Standing balance-Leahy Scale: Fair                               Pertinent Vitals/Pain Pain Assessment Pain Assessment: No/denies pain    Home Living Family/patient expects to be discharged to:: Private residence Living Arrangements: Other relatives Available Help at Discharge: Family;Available 24 hours/day (sisters) Type of Home: House Home Access: Stairs to enter Entrance Stairs-Rails: Left Entrance Stairs-Number of Steps: 3   Home Layout: One level Home Equipment: Cane - single point;Rollator (4 wheels)      Prior Function Prior Level of Function : Independent/Modified Independent;Driving             Mobility Comments: doesnt typically use device       Extremity/Trunk Assessment   Upper Extremity Assessment Upper Extremity Assessment: Defer to OT evaluation    Lower Extremity Assessment Lower Extremity Assessment: Generalized weakness    Cervical / Trunk Assessment Cervical / Trunk Assessment: Normal  Communication   Communication Communication: No apparent difficulties  Cognition Arousal: Alert Behavior During Therapy: WFL for tasks assessed/performed Overall Cognitive Status: Within Functional Limits for tasks assessed  General Comments      Exercises     Assessment/Plan    PT Assessment Patient needs continued PT services  PT Problem List Decreased activity tolerance;Decreased balance;Decreased mobility;Decreased knowledge of use of DME;Decreased safety awareness;Decreased knowledge of precautions       PT Treatment Interventions DME instruction;Gait training;Functional mobility training;Therapeutic activities;Therapeutic  exercise;Balance training;Patient/family education    PT Goals (Current goals can be found in the Care Plan section)  Acute Rehab PT Goals Patient Stated Goal: to go home PT Goal Formulation: With patient Time For Goal Achievement: 11/06/22 Potential to Achieve Goals: Good    Frequency Min 1X/week     Co-evaluation               AM-PAC PT "6 Clicks" Mobility  Outcome Measure Help needed turning from your back to your side while in a flat bed without using bedrails?: None Help needed moving from lying on your back to sitting on the side of a flat bed without using bedrails?: None Help needed moving to and from a bed to a chair (including a wheelchair)?: None Help needed standing up from a chair using your arms (e.g., wheelchair or bedside chair)?: None Help needed to walk in hospital room?: A Little Help needed climbing 3-5 steps with a railing? : A Little 6 Click Score: 22    End of Session Equipment Utilized During Treatment: Gait belt Activity Tolerance: Patient tolerated treatment well Patient left: in bed;with call bell/phone within reach;with bed alarm set Nurse Communication: Mobility status PT Visit Diagnosis: Muscle weakness (generalized) (M62.81)    Time: 4540-9811 PT Time Calculation (min) (ACUTE ONLY): 18 min   Charges:   PT Evaluation $PT Eval Low Complexity: 1 Low   PT General Charges $$ ACUTE PT VISIT: 1 Visit         Curtis Tanner M,PT Acute Rehab Services [phone]   Curtis Tanner 10/23/2022, 12:41 PM

## 2022-10-23 NOTE — Telephone Encounter (Signed)
Pharmacy Patient Advocate Encounter  Received notification from [facility] that Prior Authorization for FreeStyle Libre 3 Sensor has been APPROVED from 10/23/2022 to 04/21/2023. Ran test claim, Copay is $0.00. This test claim was processed through [facility]- copay amounts may vary at other pharmacies due to pharmacy/plan contracts, or as the patient moves through the different stages of their insurance plan.   PA #/Case ID/Reference #: [id number] Key: [id number]

## 2022-10-24 DIAGNOSIS — E162 Hypoglycemia, unspecified: Secondary | ICD-10-CM | POA: Diagnosis not present

## 2022-10-24 LAB — CBC WITH DIFFERENTIAL/PLATELET
Abs Immature Granulocytes: 0 10*3/uL (ref 0.00–0.07)
Basophils Absolute: 0 10*3/uL (ref 0.0–0.1)
Basophils Relative: 1 %
Eosinophils Absolute: 0.1 10*3/uL (ref 0.0–0.5)
Eosinophils Relative: 2 %
HCT: 44.8 % (ref 39.0–52.0)
Hemoglobin: 15.4 g/dL (ref 13.0–17.0)
Immature Granulocytes: 0 %
Lymphocytes Relative: 43 %
Lymphs Abs: 1.4 10*3/uL (ref 0.7–4.0)
MCH: 31.2 pg (ref 26.0–34.0)
MCHC: 34.4 g/dL (ref 30.0–36.0)
MCV: 90.7 fL (ref 80.0–100.0)
Monocytes Absolute: 0.4 10*3/uL (ref 0.1–1.0)
Monocytes Relative: 11 %
Neutro Abs: 1.5 10*3/uL — ABNORMAL LOW (ref 1.7–7.7)
Neutrophils Relative %: 43 %
Platelets: 87 10*3/uL — ABNORMAL LOW (ref 150–400)
RBC: 4.94 MIL/uL (ref 4.22–5.81)
RDW: 13.6 % (ref 11.5–15.5)
WBC: 3.3 10*3/uL — ABNORMAL LOW (ref 4.0–10.5)
nRBC: 0 % (ref 0.0–0.2)

## 2022-10-24 LAB — GLUCOSE, CAPILLARY
Glucose-Capillary: 106 mg/dL — ABNORMAL HIGH (ref 70–99)
Glucose-Capillary: 204 mg/dL — ABNORMAL HIGH (ref 70–99)
Glucose-Capillary: 247 mg/dL — ABNORMAL HIGH (ref 70–99)
Glucose-Capillary: 290 mg/dL — ABNORMAL HIGH (ref 70–99)
Glucose-Capillary: 310 mg/dL — ABNORMAL HIGH (ref 70–99)

## 2022-10-24 LAB — MAGNESIUM: Magnesium: 1.8 mg/dL (ref 1.7–2.4)

## 2022-10-24 LAB — BASIC METABOLIC PANEL
Anion gap: 14 (ref 5–15)
BUN: 13 mg/dL (ref 6–20)
CO2: 19 mmol/L — ABNORMAL LOW (ref 22–32)
Calcium: 9.3 mg/dL (ref 8.9–10.3)
Chloride: 106 mmol/L (ref 98–111)
Creatinine, Ser: 1.41 mg/dL — ABNORMAL HIGH (ref 0.61–1.24)
GFR, Estimated: 57 mL/min — ABNORMAL LOW (ref >60)
Glucose, Bld: 242 mg/dL — ABNORMAL HIGH (ref 70–99)
Potassium: 4.7 mmol/L (ref 3.5–5.1)
Sodium: 139 mmol/L (ref 135–145)

## 2022-10-24 MED ORDER — ALUM & MAG HYDROXIDE-SIMETH 200-200-20 MG/5ML PO SUSP
15.0000 mL | Freq: Four times a day (QID) | ORAL | Status: DC | PRN
  Administered 2022-10-24: 15 mL via ORAL
  Filled 2022-10-24: qty 30

## 2022-10-24 MED ORDER — LANTUS SOLOSTAR 100 UNIT/ML ~~LOC~~ SOPN: 15.0000 [IU] | PEN_INJECTOR | Freq: Every day | SUBCUTANEOUS | 2 refills | Status: DC

## 2022-10-24 MED ORDER — BAQSIMI ONE PACK 3 MG/DOSE NA POWD: NASAL | 3 refills | Status: AC

## 2022-10-24 MED ORDER — FREESTYLE LIBRE 3 SENSOR MISC: 3 refills | Status: DC

## 2022-10-24 MED ORDER — "PEN NEEDLES 3/16"" 31G X 5 MM MISC": 3 refills | Status: AC

## 2022-10-24 MED ORDER — HUMALOG KWIKPEN 100 UNIT/ML ~~LOC~~ SOPN: PEN_INJECTOR | SUBCUTANEOUS | 3 refills | Status: DC

## 2022-10-24 MED ORDER — FREESTYLE LIBRE 3 READER DEVI: 0 refills | Status: DC

## 2022-10-24 NOTE — Discharge Summary (Signed)
Physician Discharge Summary  Curtis Tanner ZOX:[id number] DOB: 04-09-1960 DOA: 10/22/2022  PCP: Toma Deiters, MD  Admit date: 10/22/2022 Discharge date: 10/24/2022  Admitted From: Home Disposition: Home  Recommendations for Outpatient Follow-up:  Follow up with PCP in 1-2 weeks Decrease Lantus to 15 units subcutaneously daily Started on insulin sliding scale Rx for lifestyle libre sensor/monitor given  Home Health: No Equipment/Devices: None  Discharge Condition: Stable CODE STATUS: Full code Diet recommendation: Consistent carbohydrate diet  History of present illness:  Curtis Tanner is a 62 year old male with past medical history significant for dementia, type 2 diabetes mellitus who presented to [facility] ED on 8/26 from home via EMS with confusion.  Patient was found to exhibit diminished responsiveness by family which is different from his underlying baseline.  On EMS arrival patient was noted to have a blood sugar of 32 and he received an amp of D50 and transported to the ED for further evaluation.  Family reports he walked a long distance during that day which likely contributed to his low glucose.  Sister reported that patient never administers his own insulin, and had been without any acute complaints over the last few days.  In the ED, repeat blood sugar was 68 and improved to 160 after another amp of D50.  Patient was subsequently started on a D10 drip.  COVID testing negative.  TRH consulted for admission for further evaluation management of hypoglycemia.  Hospital course:  Acute metabolic encephalopathy secondary to symptomatic hypoglycemia Hx type 2 diabetes mellitus, poorly controlled Patient presenting to ED with confusion and was initially found to have a blood sugar of 32.  Patient was given amp of D50 by EMS and transported to the ED for further evaluation.  Repeat blood sugar 68 which improved to 160 after another amp of D50.  Patient was started on a  D10 drip with marked improvement of his blood sugar.  Mental status now has improved to his typical baseline.  D10 drip was discontinued.  Hemoglobin A1c 10.4. Was seen by diabetic educator and his Lantus was decreased to 15 units subcutaneously nightly.  Continue glipizide and Jardiance.  Will also start insulin sliding scale for meal coverage.  Hypokalemia Repleted during hospitalization  Essential hypertension Continue labetalol 2 mg p.o. twice daily  Dementia Patient was more confused on admission likely secondary to hypoglycemia.  Now back at baseline.  Continue Depakote, Namenda, Seroquel.   Discharge Diagnoses:  Principal Problem:   Hypoglycemia Active Problems:   DM2 (diabetes mellitus, type 2) (HCC)   Hypothermia   Acute metabolic encephalopathy   Hypokalemia    Discharge Instructions  Discharge Instructions     Call MD for:  difficulty breathing, headache or visual disturbances   Complete by: As directed    Call MD for:  extreme fatigue   Complete by: As directed    Call MD for:  persistant dizziness or light-headedness   Complete by: As directed    Call MD for:  persistant nausea and vomiting   Complete by: As directed    Call MD for:  severe uncontrolled pain   Complete by: As directed    Call MD for:  temperature >100.4   Complete by: As directed    Diet - low sodium heart healthy   Complete by: As directed    Increase activity slowly   Complete by: As directed       Allergies as of 10/24/2022       Reactions   Ibuprofen  Swelling   Latex Swelling   Heparin (bovine) Other (See Comments)   Other reaction(s): Other (See Comments) HIT HIT    Other reaction(s): Other (See Comments) HIT        Medication List     STOP taking these medications    loratadine 10 MG tablet Commonly known as: CLARITIN   multivitamin with minerals Tabs tablet       TAKE these medications    Baqsimi One Pack 3 MG/DOSE Powd Generic drug: Glucagon Use as  directed for hypoglycemia   Creon 24000-76000 units Cpep Generic drug: Pancrelipase (Lip-Prot-Amyl) Take 72,000 Units by mouth in the morning, at noon, and at bedtime.   divalproex 125 MG DR tablet Commonly known as: DEPAKOTE Take 125 mg by mouth 2 (two) times daily.   DULoxetine 30 MG capsule Commonly known as: CYMBALTA Take 30 mg by mouth 2 (two) times daily.   Fish Oil 1000 MG Caps Take 1 capsule by mouth 3 (three) times daily.   FreeStyle Libre 3 Reader [employer] Use as directed to monitor glucose   FreeStyle Libre 3 Sensor Misc Place 1 sensor on the skin every 14 days. Use to check glucose continuously   glipiZIDE 5 MG tablet Commonly known as: GLUCOTROL Take 5 mg by mouth 2 (two) times daily.   HumaLOG KwikPen 100 UNIT/ML KwikPen Generic drug: insulin lispro For Glucose 121-150 give 1 unit, 151-200 give 2 units, 201-250 give 3 units, 2 51-300 give 5 units, 301-350 give 7 units, 351-400 give 9 units prior to meals What changed:  how much to take how to take this when to take this additional instructions   Jardiance 25 MG Tabs tablet Generic drug: empagliflozin Take 25 mg by mouth daily.   labetalol 200 MG tablet Commonly known as: NORMODYNE Take 200 mg by mouth 2 (two) times daily.   Lantus SoloStar 100 UNIT/ML Solostar Pen Generic drug: insulin glargine Inject 15 Units into the skin at bedtime. What changed: how much to take   memantine 10 MG tablet Commonly known as: NAMENDA Take 10 mg by mouth 2 (two) times daily.   QUEtiapine 50 MG tablet Commonly known as: SEROQUEL Take 50 mg by mouth at bedtime.   tamsulosin 0.4 MG Caps capsule Commonly known as: FLOMAX Take 0.4 mg by mouth at bedtime.        Follow-up Information     Toma Deiters, MD. Schedule an appointment as soon as possible for a visit in 1 week(s).   Specialty: Internal Medicine Contact information: [address] [place] [address] [phone]                 Allergies  Allergen Reactions   Ibuprofen Swelling   Latex Swelling   Heparin (Bovine) Other (See Comments)    Other reaction(s): Other (See Comments)  HIT  HIT    Other reaction(s): Other (See Comments) HIT    Consultations: None   Procedures/Studies: DG Chest Portable 1 View  Result Date: 10/22/2022 CLINICAL DATA:  Aspiration evaluation. EXAM: PORTABLE CHEST 1 VIEW COMPARISON:  December 30, 2020 FINDINGS: The heart size and mediastinal contours are within normal limits. Low lung volumes are noted. Mild atelectasis is seen within the retrocardiac region of the left lung base. There is mild, stable elevation of the right hemidiaphragm. No pleural effusion or pneumothorax is identified. Multilevel degenerative changes seen throughout the thoracic spine. IMPRESSION: Low lung volumes with mild left basilar atelectasis. Electronically Signed   By: Aram Candela  M.D.   On: 10/22/2022 22:02     Subjective: Patient seen examined bedside, currently standing up eating his breakfast.  RN and family members present at bedside. Mental status now back at baseline.  Discussed reduced dose of Lantus, and use of lifestyle libre monitor.  No other specific questions or concerns at this time.  Patient denies headache, no dizziness, no chest pain, no shortness of breath, no abdominal pain.  No acute events overnight per nursing staff.  Discharge Exam: Vitals:   10/23/22 1959 10/24/22 0423  BP: (!) 168/96 129/77  Pulse: 64 (!) 51  Resp: 20 16  Temp: 98.6 F (37 C) 98.3 F (36.8 C)  SpO2: 100% 98%   Vitals:   10/23/22 1226 10/23/22 1603 10/23/22 1959 10/24/22 0423  BP: (!) 151/77 (!) 159/81 (!) 168/96 129/77  Pulse: (!) 56 77 64 (!) 51  Resp: 16 16 20 16   Temp: 98 F (36.7 C) 98.3 F (36.8 C) 98.6 F (37 C) 98.3 F (36.8 C)  TempSrc: Oral Oral Oral   SpO2: 100% 100% 100% 98%  Weight:    78.7 kg  Height:        Physical Exam: GEN: NAD, alert, pleasantly confused HEENT: NCAT,  PERRL, EOMI, sclera clear, MMM PULM: CTAB w/o wheezes/crackles, normal respiratory effort, on room air CV: RRR w/o M/G/R GI: abd soft, NTND, NABS, no R/G/M MSK: no peripheral edema, muscle strength globally intact 5/5 bilateral upper/lower extremities NEURO: No focal neurological deficits PSYCH: normal mood/affect Integumentary: dry/intact, no rashes or wounds    The results of significant diagnostics from this hospitalization (including imaging, microbiology, ancillary and laboratory) are listed below for reference.     Microbiology: Recent Results (from the past 240 hour(s))  SARS Coronavirus 2 by RT PCR (hospital order, performed in [facility] hospital lab) *cepheid single result test* Anterior Nasal Swab     Status: None   Collection Time: 10/22/22 11:01 PM   Specimen: Anterior Nasal Swab  Result Value Ref Range Status   SARS Coronavirus 2 by RT PCR NEGATIVE NEGATIVE Final    Comment: Performed at [facility], [address] [place], [place] [address]     Labs: BNP (last 3 results) No results for input(s): "BNP" in the last 8760 hours. Basic Metabolic Panel: Recent Labs  Lab 10/22/22 2148 10/22/22 2245 10/23/22 0516  NA 137  --  136  K 3.3*  --  4.0  CL 104  --  106  CO2 20*  --  26  GLUCOSE 163*  --  301*  BUN 13  --  13  CREATININE 1.21  --  1.28*  CALCIUM 8.9  --  8.7*  MG  --  2.1 1.8   Liver Function Tests: Recent Labs  Lab 10/23/22 0516  AST 17  ALT 17  ALKPHOS 63  BILITOT 0.4  PROT 5.9*  ALBUMIN 3.0*   No results for input(s): "LIPASE", "AMYLASE" in the last 168 hours. No results for input(s): "AMMONIA" in the last 168 hours. CBC: Recent Labs  Lab 10/22/22 2148 10/23/22 0516  WBC 5.5 4.3  NEUTROABS  --  2.5  HGB 13.2 12.8*  HCT 38.9* 36.8*  MCV 87.8 85.8  PLT 144* 144*   Cardiac Enzymes: No results for input(s): "CKTOTAL", "CKMB", "CKMBINDEX", "TROPONINI" in the last 168 hours. BNP: Invalid input(s): "POCBNP" CBG: Recent  Labs  Lab 10/23/22 2046 10/23/22 2132 10/24/22 0007 10/24/22 0418 10/24/22 0813  GLUCAP 194* 249* 247* 204* 106*  D-Dimer No results for input(s): "DDIMER" in the last 72 hours. Hgb A1c Recent Labs    10/22/22 2148  HGBA1C 10.4*   Lipid Profile No results for input(s): "CHOL", "HDL", "LDLCALC", "TRIG", "CHOLHDL", "LDLDIRECT" in the last 72 hours. Thyroid function studies Recent Labs    10/22/22 2245  TSH 1.515   Anemia work up Recent Labs    10/23/22 0516  VITAMINB12 332   Urinalysis    Component Value Date/Time   COLORURINE YELLOW 12/30/2020 2241   APPEARANCEUR CLEAR 12/30/2020 2241   LABSPEC 1.025 12/30/2020 2241   PHURINE 5.0 12/30/2020 2241   GLUCOSEU >=500 (A) 12/30/2020 2241   HGBUR NEGATIVE 12/30/2020 2241   BILIRUBINUR NEGATIVE 12/30/2020 2241   KETONESUR NEGATIVE 12/30/2020 2241   PROTEINUR NEGATIVE 12/30/2020 2241   NITRITE NEGATIVE 12/30/2020 2241   LEUKOCYTESUR NEGATIVE 12/30/2020 2241   Sepsis Labs Recent Labs  Lab 10/22/22 2148 10/23/22 0516  WBC 5.5 4.3   Microbiology Recent Results (from the past 240 hour(s))  SARS Coronavirus 2 by RT PCR (hospital order, performed in [facility] hospital lab) *cepheid single result test* Anterior Nasal Swab     Status: None   Collection Time: 10/22/22 11:01 PM   Specimen: Anterior Nasal Swab  Result Value Ref Range Status   SARS Coronavirus 2 by RT PCR NEGATIVE NEGATIVE Final    Comment: Performed at [facility], [address], [place], [place] [address]     Time coordinating discharge: Over 30 minutes  SIGNED:   Alvira Philips [place], DO  [facility] 10/24/2022, 10:47 AM

## 2022-10-24 NOTE — Inpatient Diabetes Management (Signed)
Inpatient Diabetes Program Recommendations  AACE/ADA: New Consensus Statement on Inpatient Glycemic Control   Target Ranges:  Prepandial:   less than 140 mg/dL      Peak postprandial:   less than 180 mg/dL (1-2 hours)      Critically ill patients:  140 - 180 mg/dL    Latest Reference Range & Units 10/23/22 07:28 10/23/22 12:24 10/23/22 16:00 10/23/22 20:46 10/23/22 21:32 10/24/22 00:07 10/24/22 04:18 10/24/22 08:13  Glucose-Capillary 70 - 99 mg/dL 409 (H) 811 (H) 914 (H) 194 (H) 249 (H) 247 (H) 204 (H) 106 (H)   Review of Glycemic Control  Diabetes history: DM2 Outpatient Diabetes medications: Jardiance 25 mg daily, Glipizide 5 mg BID, Humalog 20 unit BID, Lantus 20 units QHS Current orders for Inpatient glycemic control: Novolog 0-9 units TID with meals, Novolog 0-5 units at bedtime, Semglee 15 units QHS   Inpatient Diabetes Program Recommendations:     Insulin: Noted patient initially hypoglycemic which has resolved. Please consider ordering Novolog 3 units TID with meals for meal coverage if patient eats at least 50% of meals.   Outpatient: At time of discharge, consider prescribing Baqsimi (nasal glucagon), FreeStyle Libre3 sensors [phone]), FreeStyle Libre3 reader [phone]).  Also, please provide Humalog correction scale (same as correction scale currently ordered) for patient's sister to use at home for correction when needed. Also, please adjust outpatient Lantus dose to 15 units at bedtime.  Thanks, Orlando Penner, RN, MSN, CDCES Diabetes Coordinator Inpatient Diabetes Program [phone] (Team Pager from 8am to 5pm)

## 2022-10-24 NOTE — Progress Notes (Addendum)
  Update-5:01pm- CSW informed by CM unable to get in contact with patients sister Curtis Tanner. CSW attempted to call patients sister and LVM. Per MD patient medically ready for dc. CSW requested Welfare check to patients home address listed in [place]. CSW awaiting call back.   CSW received consult for patient. Patient reports PTA he comes from home with sister. CSW offered patient [facility] guide. Patient accepted. Patient reports he will have transportation when medically ready for dc. All questions answered. No further questions reported at this time. SDOH screening complete.

## 2022-10-24 NOTE — Inpatient Diabetes Management (Signed)
Inpatient Diabetes Program Recommendations  AACE/ADA: New Consensus Statement on Inpatient Glycemic Control (2015)  Target Ranges:  Prepandial:   less than 140 mg/dL      Peak postprandial:   less than 180 mg/dL (1-2 hours)      Critically ill patients:  140 - 180 mg/dL   Lab Results  Component Value Date   GLUCAP 106 (H) 10/24/2022   HGBA1C 10.4 (H) 10/22/2022    Met with sister and family at bedside.  Educated them on the [employer] 3.  Walked through each step on how to apply the sensor and where to apply.  MD will order the reader for the family to pick up at the pharmacy.  Once they pick up the reader they can apply the sensor.  Explained there is 20 minute warmup.  His sister can download the [employer] app on her phone to request he share his glucose trends.  Reviewed alarms, arrows, waterproof, how to discard the old sensor and when to apply a new sensor.  Sister verbalizes understanding.  She states they have many friends at church who wear the Freestyle Libre CGM and they can ask them if they have questions.  All questions answered.    Will continue to follow while inpatient.  Thank you, Dulce Sellar, MSN, CDCES Diabetes Coordinator Inpatient Diabetes Program [phone] (team pager from 8a-5p)

## 2022-10-24 NOTE — Progress Notes (Signed)
Mobility Specialist Progress Note:    10/24/22 1158  Mobility  Activity Ambulated with assistance in hallway  Level of Assistance Contact guard assist, steadying assist  Assistive Device None  Distance Ambulated (ft) 400 ft  Activity Response Tolerated well  Mobility Referral Yes  $Mobility charge 1 Mobility  Mobility Specialist Start Time (ACUTE ONLY) 1102  Mobility Specialist Stop Time (ACUTE ONLY) 1114  Mobility Specialist Time Calculation (min) (ACUTE ONLY) 12 min   Received pt sitting EOb having no complaints and agreeable to mobility. Pt was asymptomatic throughout ambulation and returned to room w/o fault. Left seated EOB w/ call bell in reach and all needs met.   Thompson Grayer Mobility Specialist  Please contact vis Secure Chat or  Rehab Office [phone]

## 2022-10-26 NOTE — ED Provider Notes (Signed)
Physical exam addendum for 8/26 ED evaluation  General : NAD Cardiac: RR Pulm:  No respiratory distress Psych: Stable Abdominal: No focal tendenress   Terald Sleeper, MD 10/26/22 1105

## 2023-04-08 IMAGING — CT CT ABD-PELV W/O CM
2 of 4 series · 16 of 46 positions shown, 18 images · non-contrast
Comparison: 08/27/2003

CLINICAL DATA: Sepsis

EXAM:
CT ABDOMEN AND PELVIS WITHOUT CONTRAST
TECHNIQUE: Multidetector CT imaging of the abdomen and pelvis was performed
following the standard protocol without IV contrast.

[Series 3: a/p w/o 5mm · axial · non-contrast · 0.76mm/px · z∈[+937,+1387]mm · 13 of 106 slices shown, 15 images]
[im 8/106  soft-tissue]
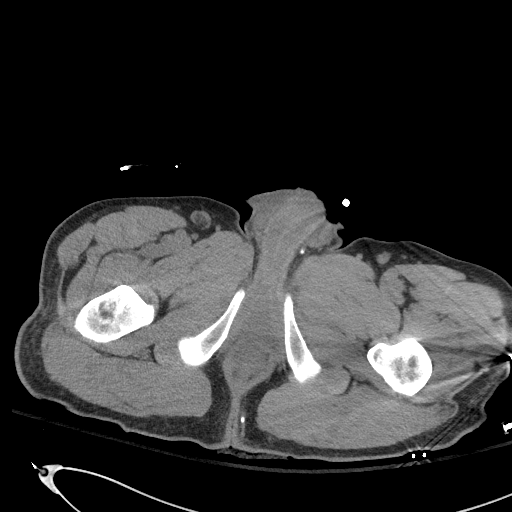
[im 8/106  bone]
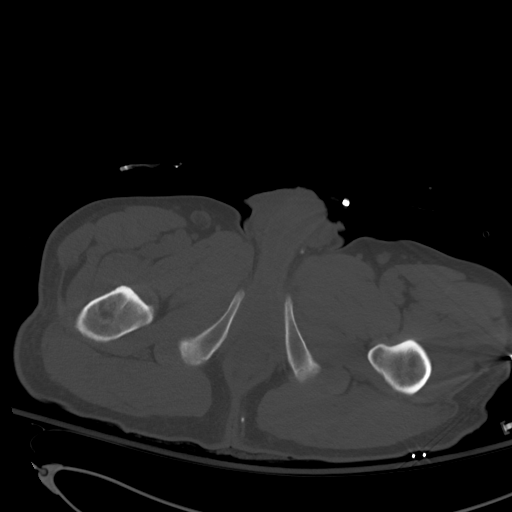
[im 15/106  soft-tissue]
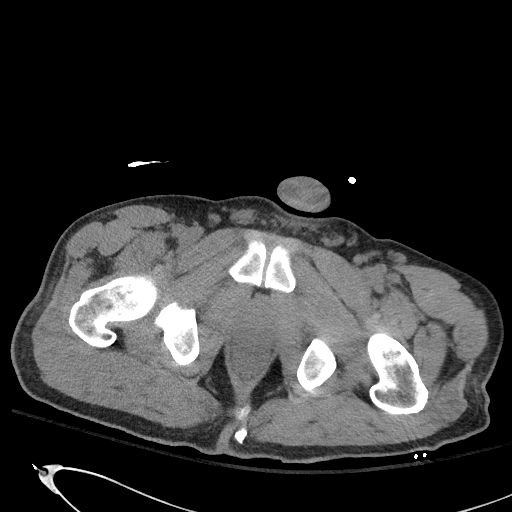
[im 22/106  soft-tissue]
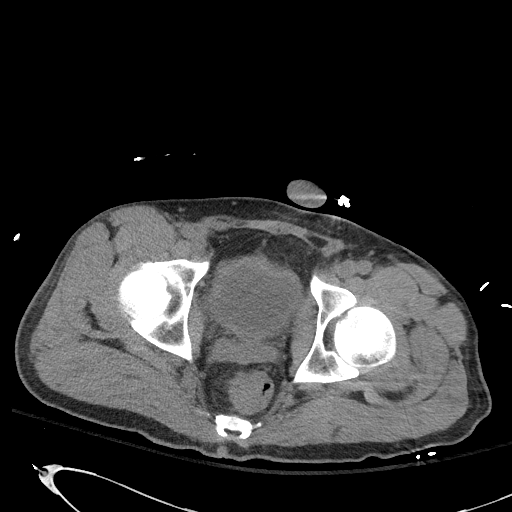
[im 29/106  soft-tissue]
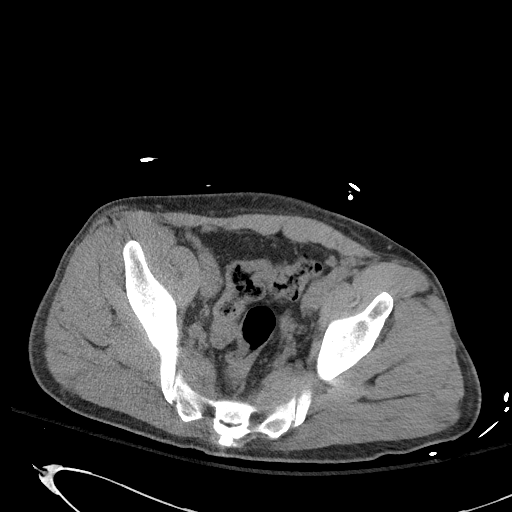
[im 37/106  soft-tissue]
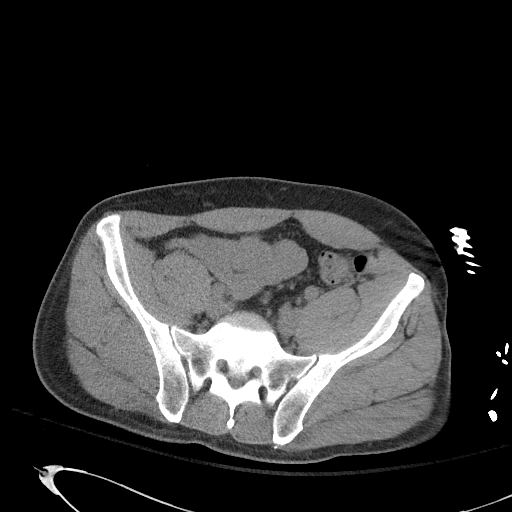
[im 44/106  soft-tissue]
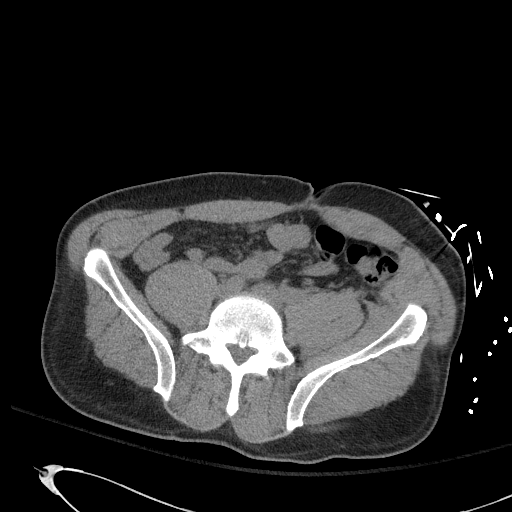
[im 55/106  soft-tissue]
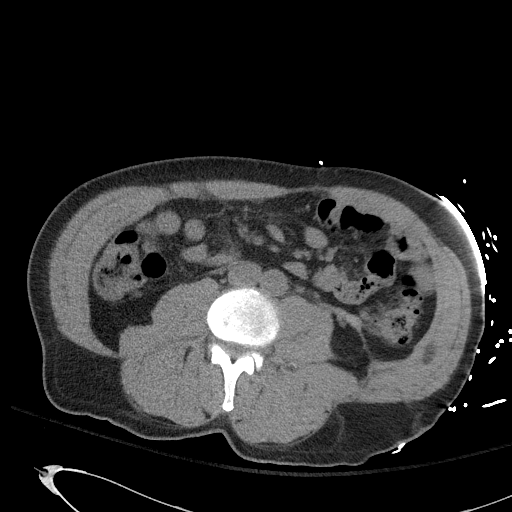
[im 62/106  soft-tissue]
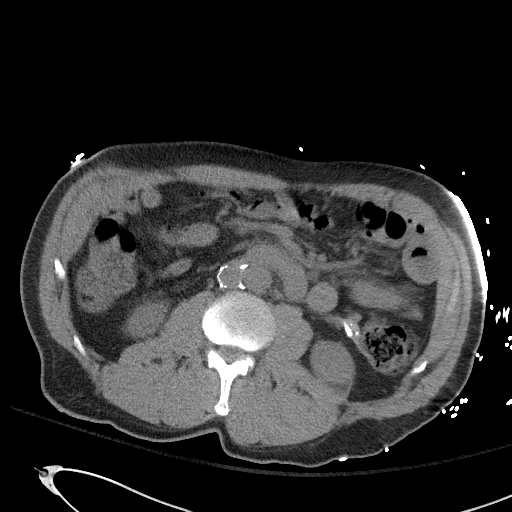
[im 69/106  soft-tissue]
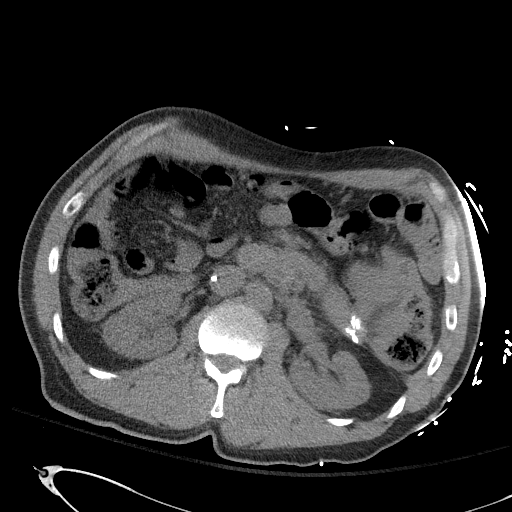
[im 69/106  bone]
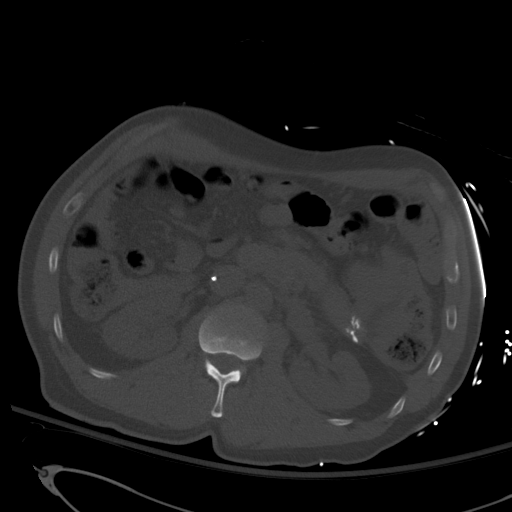
[im 77/106  soft-tissue]
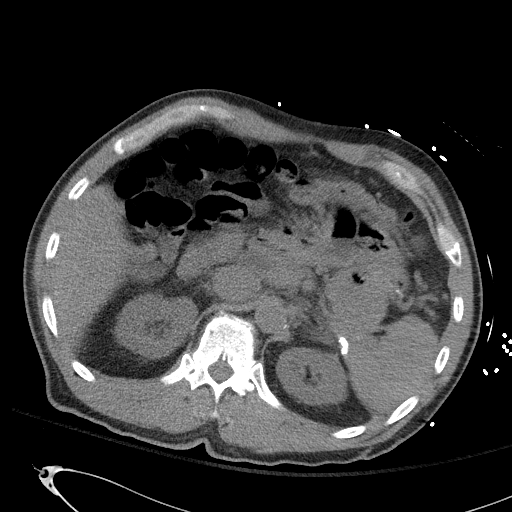
[im 84/106  soft-tissue]
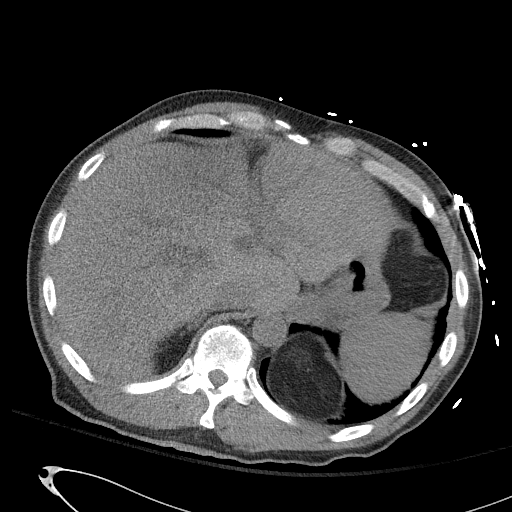
[im 91/106  soft-tissue]
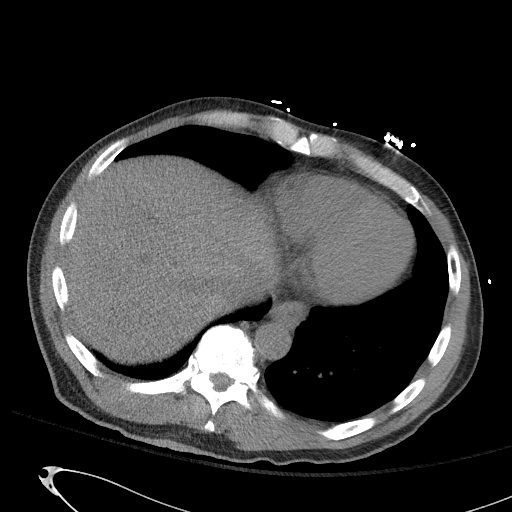
[im 98/106  soft-tissue]
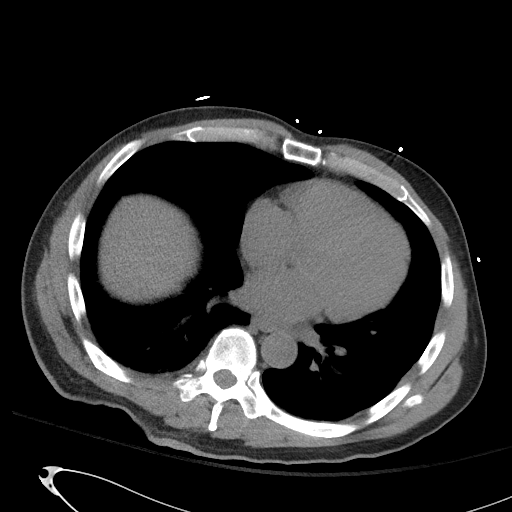

[Series 6: a/p w/o cor · coronal · non-contrast · 0.88mm/px · 3 of 151 slices shown]
[im 51/151  soft-tissue]
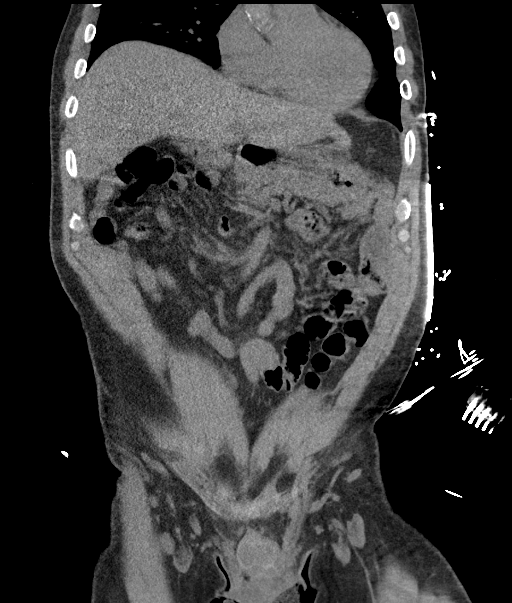
[im 67/151  soft-tissue]
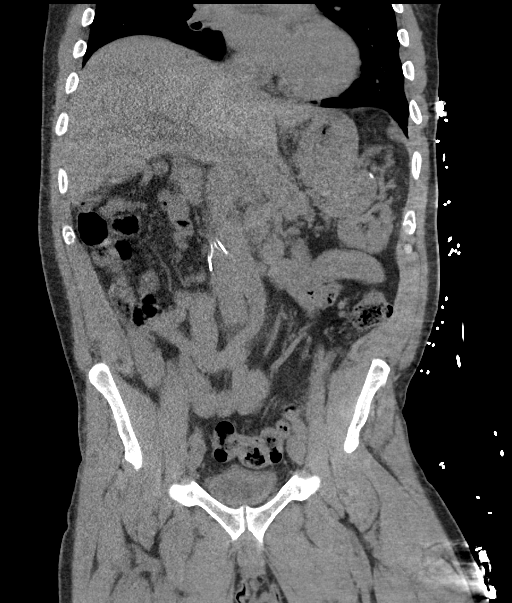
[im 84/151  soft-tissue]
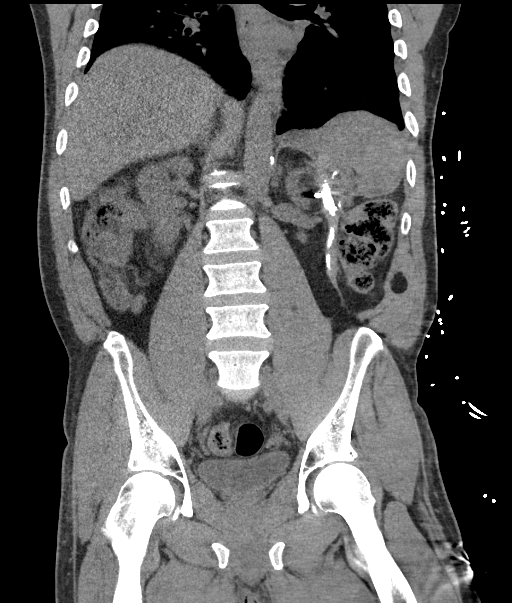

[16 of 46 positions shown; findings below may reference images not displayed]

FINDINGS: Lower chest: No acute abnormality.

Hepatobiliary: Gallbladder is not well appreciated likely
decompressed.

Pancreas: Pancreas demonstrates calcifications and metallic
densities along the tail possibly related to prior embolotherapy.

Spleen: Normal in size without focal abnormality.

Adrenals/Urinary Tract: Adrenal glands are within normal limits.
Nonobstructing renal calculi are noted in the lower poles
bilaterally. The largest of these lies on the right measuring 7 mm.
No ureteral stones are seen. The bladder is partially distended.

Stomach/Bowel: No obstructive or inflammatory changes of the colon
are seen. The appendix is within normal limits. Small bowel and
stomach are unremarkable.

Vascular/Lymphatic: IVC filter is noted in place. No significant
lymphadenopathy is seen. Atherosclerotic calcifications of the aorta
are noted.

Reproductive: Prostate is unremarkable.

Other: No abdominal wall hernia or abnormality. No abdominopelvic
ascites.

Musculoskeletal: No acute or significant osseous findings.
IMPRESSION: Chronic appearing changes in the tail of the pancreas with metallic
coils consistent with prior embolic therapy and calcifications.

Nonobstructing renal calculi bilaterally.

No other focal abnormality is noted.

## 2023-06-24 ENCOUNTER — Encounter (HOSPITAL_COMMUNITY): Payer: Self-pay | Admitting: *Deleted

## 2023-06-24 ENCOUNTER — Emergency Department (HOSPITAL_COMMUNITY)
Admission: EM | Admit: 2023-06-24 | Discharge: 2023-06-24 | Disposition: A | Discharge status: Home / Self Care | Attending: Emergency Medicine | Admitting: Emergency Medicine

## 2023-06-24 ENCOUNTER — Other Ambulatory Visit: Payer: Self-pay

## 2023-06-24 DIAGNOSIS — M545 Low back pain, unspecified: Secondary | ICD-10-CM | POA: Insufficient documentation

## 2023-06-24 DIAGNOSIS — G9341 Metabolic encephalopathy: Secondary | ICD-10-CM | POA: Insufficient documentation

## 2023-06-24 DIAGNOSIS — Z0279 Encounter for issue of other medical certificate: Secondary | ICD-10-CM | POA: Insufficient documentation

## 2023-06-24 DIAGNOSIS — F0390 Unspecified dementia without behavioral disturbance: Secondary | ICD-10-CM | POA: Insufficient documentation

## 2023-06-24 DIAGNOSIS — Z91040 Latex allergy status: Secondary | ICD-10-CM | POA: Insufficient documentation

## 2023-06-24 DIAGNOSIS — F03911 Unspecified dementia, unspecified severity, with agitation: Secondary | ICD-10-CM | POA: Diagnosis not present

## 2023-06-24 DIAGNOSIS — R451 Restlessness and agitation: Secondary | ICD-10-CM | POA: Diagnosis present

## 2023-06-24 DIAGNOSIS — R4182 Altered mental status, unspecified: Secondary | ICD-10-CM | POA: Diagnosis not present

## 2023-06-24 DIAGNOSIS — Z7984 Long term (current) use of oral hypoglycemic drugs: Secondary | ICD-10-CM | POA: Insufficient documentation

## 2023-06-24 DIAGNOSIS — Z79899 Other long term (current) drug therapy: Secondary | ICD-10-CM | POA: Insufficient documentation

## 2023-06-24 DIAGNOSIS — Z794 Long term (current) use of insulin: Secondary | ICD-10-CM | POA: Insufficient documentation

## 2023-06-24 DIAGNOSIS — R413 Other amnesia: Secondary | ICD-10-CM | POA: Diagnosis not present

## 2023-06-24 LAB — CBC WITH DIFFERENTIAL/PLATELET
Abs Immature Granulocytes: 0.02 10*3/uL (ref 0.00–0.07)
Basophils Absolute: 0 10*3/uL (ref 0.0–0.1)
Basophils Relative: 1 %
Eosinophils Absolute: 0.1 10*3/uL (ref 0.0–0.5)
Eosinophils Relative: 2 %
HCT: 46.7 % (ref 39.0–52.0)
Hemoglobin: 15.7 g/dL (ref 13.0–17.0)
Immature Granulocytes: 1 %
Lymphocytes Relative: 30 %
Lymphs Abs: 0.9 10*3/uL (ref 0.7–4.0)
MCH: 30.5 pg (ref 26.0–34.0)
MCHC: 33.6 g/dL (ref 30.0–36.0)
MCV: 90.9 fL (ref 80.0–100.0)
Monocytes Absolute: 0.4 10*3/uL (ref 0.1–1.0)
Monocytes Relative: 11 %
Neutro Abs: 1.8 10*3/uL (ref 1.7–7.7)
Neutrophils Relative %: 55 %
Platelets: 127 10*3/uL — ABNORMAL LOW (ref 150–400)
RBC: 5.14 MIL/uL (ref 4.22–5.81)
RDW: 13.9 % (ref 11.5–15.5)
WBC: 3.2 10*3/uL — ABNORMAL LOW (ref 4.0–10.5)
nRBC: 0 % (ref 0.0–0.2)

## 2023-06-24 LAB — COMPREHENSIVE METABOLIC PANEL WITH GFR
ALT: 27 U/L (ref 0–44)
AST: 33 U/L (ref 15–41)
Albumin: 3.6 g/dL (ref 3.5–5.0)
Alkaline Phosphatase: 46 U/L (ref 38–126)
Anion gap: 11 (ref 5–15)
BUN: 22 mg/dL (ref 8–23)
CO2: 25 mmol/L (ref 22–32)
Calcium: 9.3 mg/dL (ref 8.9–10.3)
Chloride: 98 mmol/L (ref 98–111)
Creatinine, Ser: 1.69 mg/dL — ABNORMAL HIGH (ref 0.61–1.24)
GFR, Estimated: 46 mL/min — ABNORMAL LOW (ref >60)
Glucose, Bld: 289 mg/dL — ABNORMAL HIGH (ref 70–99)
Potassium: 4.4 mmol/L (ref 3.5–5.1)
Sodium: 134 mmol/L — ABNORMAL LOW (ref 135–145)
Total Bilirubin: 0.7 mg/dL (ref 0.0–1.2)
Total Protein: 7.1 g/dL (ref 6.5–8.1)

## 2023-06-24 LAB — ETHANOL: Alcohol, Ethyl (B): <15 mg/dL (ref <15)

## 2023-06-24 LAB — RAPID URINE DRUG SCREEN, HOSP PERFORMED
Amphetamines: NOT DETECTED
Barbiturates: NOT DETECTED
Benzodiazepines: NOT DETECTED
Cocaine: NOT DETECTED
Opiates: NOT DETECTED
Tetrahydrocannabinol: NOT DETECTED

## 2023-06-24 NOTE — ED Triage Notes (Signed)
 Pt BIB [facility] from [facility] for a psych eval  Facility stated pt has been getting more physically aggressive over the last week. They stated pt is on the locked unit and will try to bold through staff when the doors open  Pt's NP at facility has been adjusting his medications. Pt is usually on abilify 5mg  and seroquel 100mg .  They have been titrating his medications to abilify 10mg  and seroquel 25mg   Pt was given ativan  1mg  at 0830 today  Pt states he thinks he is here due to his back pain and lower back pain  Pt is calm and cooperative at this time and used urinal without difficulty upon arrival to ED

## 2023-06-24 NOTE — ED Notes (Signed)
 Nurse attempted to call Maurene Sours for update on pt but only received VM.

## 2023-06-24 NOTE — BH Assessment (Signed)
 Comprehensive Clinical Assessment (CCA) Note  06/24/2023 Curtis Tanner [id number]  The patient demonstrates the following risk factors for suicide: Chronic risk factors for suicide include: medical illness acute metabolic encephalopathy . Acute risk factors for suicide include: N/A. Protective factors for this patient include: positive social support and positive therapeutic relationship. Considering these factors, the overall suicide risk at this point appears to be low. Patient is not appropriate for outpatient follow up.   Pt is a 63 yo male who was brought in from is care facility named [facility]. Per chart, pt has had increasing AMS and increasing physical aggression. Per chart, pt lives on a locked unit and was trying to elope by trying to rush out the doors any time they were opened by someone.   Pt stated he is not and has not been married. Pt stated that he has no biological children but helped raised 2 stepchildren (1 boy and 1 girl) who he stated he is still in contact with. Pt stated that his supports were his sister and step-children (adult). Pt stated that before [facility] he was living with his sister.   Pt was able to correctly name identifying data such as full name and DOB. Pt seemed oriented but distracted at times by noise in the hallway.      Chief Complaint:  Chief Complaint  Patient presents with   Medical Clearance   Visit Diagnosis:  Altered Mental Status Acute Metabolic Encephalopathy    CCA Screening, Triage and Referral (STR)  Patient Reported Information How did you hear about us ? Other (Comment) (EMS called from [facility])  What Is the Reason for Your Visit/Call Today? Per chart, AMS and increasing physical aggression. Per chart, pt lives on a locked unit and was trying to elope by trying to rush out the doors any time they were opened by someone.  How Long Has This Been Causing You Problems? -- (unknown)  What Do You Feel Would  Help You the Most Today? -- (Pt could not respond due to AMS.)   Have You Recently Had Any Thoughts About Hurting Yourself? No  Are You Planning to Commit Suicide/Harm Yourself At This time? No   Flowsheet Row ED from 06/24/2023 in [facility] Emergency Department at [facility] ED to Hosp-Admission (Discharged) from 10/22/2022 in [facility] ED to Hosp-Admission (Discharged) from 12/30/2020 in [place] [facility] PROGRESSIVE CARE  C-SSRS RISK CATEGORY No Risk No Risk No Risk       Have you Recently Had Thoughts About Hurting Someone Curtis Tanner? No  Are You Planning to Harm Someone at This Time? No  Explanation: na   Have You Used Any Alcohol or Drugs in the Past 24 Hours? No  How Long Ago Did You Use Drugs or Alcohol? No data recorded What Did You Use and How Much? No data recorded  Do You Currently Have a Therapist/Psychiatrist? No  Name of Therapist/Psychiatrist:    Have You Been Recently Discharged From Any Office Practice or Programs? No  Explanation of Discharge From Practice/Program: No data recorded    CCA Screening Triage Referral Assessment Type of Contact: Tele-Assessment  Telemedicine Service Delivery:   Is this Initial or Reassessment? Is this Initial or Reassessment?: Initial Assessment  Date Telepsych consult ordered in [facility]:  Date Telepsych consult ordered in [facility]: 06/24/23  Time Telepsych consult ordered in [facility]:  Time Telepsych consult ordered in [facility]: 1055  Location of Assessment: [facility] ED  Provider Location:  [facility]   Collateral Involvement: none   Does Patient Have a Automotive engineer Guardian? No  Legal Guardian Contact Information: none reported  Copy of Legal Guardianship Form: -- (na)  Legal Guardian Notified of Arrival: -- (na)  Legal Guardian Notified of Pending Discharge: -- (na)  If Minor and Not Living with Parent(s), Who has Custody? adult  Is CPS involved or ever been involved? -- (none  reported)  Is APS involved or ever been involved? -- (none reported)   Patient Determined To Be At Risk for Harm To Self or Others Based on Review of Patient Reported Information or Presenting Complaint? No  Method: No Plan  Availability of Means: No access or NA  Intent: Vague intent or NA  Notification Required: No need or identified person  Additional Information for Danger to Others Potential: -- (na)  Additional Comments for Danger to Others Potential: per chart, increased physical aggression recently  Are There Guns or Other Weapons in Your Home? No  Types of Guns/Weapons: na  Are These Weapons Safely Secured?                            -- (na)  Who Could Verify You Are Able To Have These Secured: na  Do You Have any Outstanding Charges, Pending Court Dates, Parole/Probation? Pt could not respond due to AMS. None reported  Contacted To Inform of Risk of Harm To Self or Others: -- (na)    Does Patient Present under Involuntary Commitment? No    [place] of Residence: [place]   Patient Currently Receiving the Following Services: Not Receiving Services (no current OP psychiatric providers reported)   Determination of Need: Routine (7 days) (Per Sarahann Cumins NP pt does not meet inpatient psychiatric criteria. Pt is psych cleared.)   Options For Referral: Other: Comment     CCA Biopsychosocial Patient Reported Schizophrenia/Schizoaffective Diagnosis in Past: No   Strengths: cooperative   Mental Health Symptoms Depression:  None   Duration of Depressive symptoms:    Mania:  None   Anxiety:   Restlessness   Psychosis:  None   Duration of Psychotic symptoms:    Trauma:  None   Obsessions:  None   Compulsions:  None   Inattention:  None   Hyperactivity/Impulsivity:  None   Oppositional/Defiant Behaviors:  None   Emotional Irregularity:  None   Other Mood/Personality Symptoms:  none observed    Mental Status Exam Appearance and  self-care  Stature:  Average   Weight:  Average weight   Clothing:  Casual; Neat/clean   Grooming:  Normal   Cosmetic use:  None   Posture/gait:  Normal   Motor activity:  Not Remarkable   Sensorium  Attention:  -- (Seemed oriented but distracted at times)   Concentration:  Scattered   Orientation:  X5 (Pt was able to correctly name identifying data such as full name and DOB.)   Recall/memory:  -- (Not able to assess due to his AMS (acute metabolic encephalopathy))   Affect and Mood  Affect:  Appropriate   Mood:  Euthymic   Relating  Eye contact:  Normal   Facial expression:  Responsive   Attitude toward examiner:  Cooperative   Thought and Language  Speech flow: Garbled; Paucity (a bit garbled as speaking)   Thought content:  Appropriate to Mood and Circumstances   Preoccupation:  -- (Not able to assess due to his AMS (acute metabolic  encephalopathy))   Hallucinations:  None (denied)   Organization:  Coherent   [employer] of Knowledge:  -- (Not able to assess due to his AMS (acute metabolic encephalopathy))   Intelligence:  -- (Not able to assess due to his AMS (acute metabolic encephalopathy))   Abstraction:  Concrete   Judgement:  Fair   Reality Testing:  Adequate   Insight:  None/zero insight   Decision Making:  Impulsive   Social Functioning  Social Maturity:  -- (Not able to assess due to his AMS (acute metabolic encephalopathy))   Social Judgement:  -- (Not able to assess due to his AMS (acute metabolic encephalopathy))   Stress  Stressors:  Housing (aggressive toward staff per facility)   Coping Ability:  Exhausted   Skill Deficits:  -- (Not able to assess due to his AMS (acute metabolic encephalopathy))   Supports:  Friends/Service system; Family (sister and step-children per pt)     Religion: Religion/Spirituality Are You A Religious Person?: Yes What is Your Religious Affiliation?:  (none named) How Might This  Affect Treatment?: unknown  Leisure/Recreation: Leisure / Recreation Do You Have Hobbies?: Yes Leisure and Hobbies: watching basketball  Exercise/Diet: Exercise/Diet Do You Exercise?: No Have You Gained or Lost A Significant Amount of Weight in the Past Six Months?: No Do You Follow a Special Diet?: No Do You Have Any Trouble Sleeping?:  (Not able to assess due to his AMS (acute metabolic encephalopathy))   CCA Employment/Education Employment/Work Situation: Employment / Work Psychologist, occupational Employment Situation: Retired Passenger transport manager has Been Impacted by Current Illness:  (na) Has Patient ever Been in Equities trader?: No  Education: Education Is Patient Currently Attending School?: No Last Grade Completed: 10 Did You Product manager?: No Did You Have An Individualized Education Program (IIEP): No Did You Have Any Difficulty At School?: No Patient's Education Has Been Impacted by Current Illness: No   CCA Family/Childhood History Family and Relationship History: Family history Marital status: Single Does patient have children?: No (Pt stated that he has no biological children but helped raised 2 stepchildren (1 boy and 1 girl) who he stated he is still in contact with.)  Childhood History:  Childhood History By whom was/is the patient raised?:  (Not able to assess due to his AMS (acute metabolic encephalopathy)) Did patient suffer any verbal/emotional/physical/sexual abuse as a child?:  (Not able to assess due to his AMS (acute metabolic encephalopathy)) Did patient suffer from severe childhood neglect?:  (Not able to assess due to his AMS (acute metabolic encephalopathy)) Has patient ever been sexually abused/assaulted/raped as an adolescent or adult?:  (Not able to assess due to his AMS (acute metabolic encephalopathy)) Was the patient ever a victim of a crime or a disaster?:  (Not able to assess due to his AMS (acute metabolic encephalopathy)) Witnessed domestic violence?:   (Not able to assess due to his AMS (acute metabolic encephalopathy)) Has patient been affected by domestic violence as an adult?:  (Not able to assess due to his AMS (acute metabolic encephalopathy))       CCA Substance Use Alcohol/Drug Use: Alcohol / Drug Use Pain Medications: see MAR Prescriptions: see MAR Over the Counter: see MAR History of alcohol / drug use?: No history of alcohol / drug abuse                         ASAM's:  Six Dimensions of Multidimensional Assessment  Dimension 1:  Acute Intoxication and/or Withdrawal  Potential:      Dimension 2:  Biomedical Conditions and Complications:      Dimension 3:  Emotional, Behavioral, or Cognitive Conditions and Complications:     Dimension 4:  Readiness to Change:     Dimension 5:  Relapse, Continued use, or Continued Problem Potential:     Dimension 6:  Recovery/Living Environment:     ASAM Severity Score:    ASAM Recommended Level of Treatment:     Substance use Disorder (SUD)    Recommendations for Services/Supports/Treatments:    Disposition Recommendation per psychiatric provider: Per Sarahann Cumins NP pt does not meet inpatient psychiatric criteria and is psych cleared.    DSM5 Diagnoses: Patient Active Problem List   Diagnosis Date Noted   Hypothermia 10/23/2022   Acute metabolic encephalopathy 10/23/2022   Hypokalemia 10/23/2022   Hypoglycemia 10/22/2022   Acute encephalopathy 12/31/2020   DM2 (diabetes mellitus, type 2) (HCC) 12/31/2020   HTN (hypertension) 12/31/2020   GERD (gastroesophageal reflux disease) 12/31/2020   AKI (acute kidney injury) (HCC) 12/31/2020   Bradycardia 12/31/2020   Altered mental status 12/31/2020     Referrals to Alternative Service(s): Referred to Alternative Service(s):   Place:   Date:   Time:    Referred to Alternative Service(s):   Place:   Date:   Time:    Referred to Alternative Service(s):   Place:   Date:   Time:    Referred to Alternative  Service(s):   Place:   Date:   Time:     Johnay Mano T, Counselor

## 2023-06-24 NOTE — ED Notes (Signed)
 TTS in pt room

## 2023-06-24 NOTE — ED Provider Notes (Signed)
 [place] EMERGENCY DEPARTMENT AT [facility] Provider Note   CSN: [id number] Arrival date & time: 06/24/23  9562     History  Chief Complaint  Patient presents with   Medical Clearance    Curtis Tanner is a 64 y.o. male.  HPI , Dementia, level 5 caveat. Nursing facility reports the patient has been more aggressive over the last week, requests psych eval.  Nurse practitioner at the facility notes that medication adjustments have been made, Abilify and Seroquel have been adjusted.  With agitation patient also received Ativan  this morning.  Patient states that he is here for back pain, he is awake, alert, otherwise not insightful.    Home Medications Prior to Admission medications   Medication Sig Start Date End Date Taking? Authorizing Provider  Continuous Glucose Receiver (FREESTYLE LIBRE 3 READER) DEVI Use as directed to monitor glucose 10/24/22   [place], Eric J, DO  Continuous Glucose Sensor (FREESTYLE LIBRE 3 SENSOR) MISC Place 1 sensor on the skin every 14 days. Use to check glucose continuously 10/24/22   [place], Rema Care, DO  CREON 24000-76000 units CPEP Take 72,000 Units by mouth in the morning, at noon, and at bedtime.    [provider]  divalproex (DEPAKOTE) 125 MG DR tablet Take 125 mg by mouth 2 (two) times daily. 10/16/22   [provider]  DULoxetine (CYMBALTA) 30 MG capsule Take 30 mg by mouth 2 (two) times daily. 10/17/19   [provider]  glipiZIDE (GLUCOTROL) 5 MG tablet Take 5 mg by mouth 2 (two) times daily. 10/16/22   [provider]  Glucagon  (BAQSIMI  ONE PACK) 3 MG/DOSE POWD Use as directed for hypoglycemia 10/24/22   [place], Rema Care, DO  HUMALOG  KWIKPEN 100 UNIT/ML KwikPen For Glucose 121-150 give 1 unit, 151-200 give 2 units, 201-250 give 3 units, 2 51-300 give 5 units, 301-350 give 7 units, 351-400 give 9 units prior to meals 10/24/22   [place], Rema Care, DO  Insulin  Pen Needle (PEN NEEDLES 3/16") 31G X 5 MM  MISC Use as directed with insulin  pen 10/24/22   [place], Rema Care, DO  JARDIANCE 25 MG TABS tablet Take 25 mg by mouth daily. 10/16/22   [provider]  labetalol (NORMODYNE) 200 MG tablet Take 200 mg by mouth 2 (two) times daily.    [provider]  LANTUS  SOLOSTAR 100 UNIT/ML Solostar Pen Inject 15 Units into the skin at bedtime. 10/24/22   [place], Eric J, DO  memantine (NAMENDA) 10 MG tablet Take 10 mg by mouth 2 (two) times daily. 04/30/22   [provider]  Omega-3 Fatty Acids (FISH OIL) 1000 MG CAPS Take 1 capsule by mouth 3 (three) times daily. 09/20/22   [provider]  QUEtiapine (SEROQUEL) 50 MG tablet Take 50 mg by mouth at bedtime. 04/30/22   [provider]      Allergies    Ibuprofen, Latex, and Heparin (bovine)    Review of Systems   Review of Systems  Physical Exam Updated Vital Signs BP (!) 145/78   Pulse 67   Temp 98 F (36.7 C) (Oral)   Resp 18   SpO2 96%  Physical Exam Vitals and nursing note reviewed.  Constitutional:      General: He is not in acute distress.    Appearance: He is well-developed.  HENT:     Head: Normocephalic and atraumatic.  Eyes:     Conjunctiva/sclera: Conjunctivae normal.  Cardiovascular:     Rate and  Rhythm: Normal rate and regular rhythm.  Pulmonary:     Effort: Pulmonary effort is normal. No respiratory distress.     Breath sounds: No stridor.  Abdominal:     General: There is no distension.  Skin:    General: Skin is warm and dry.  Neurological:     Mental Status: He is alert.     Cranial Nerves: No cranial nerve deficit.     Motor: Atrophy present. No tremor.  Psychiatric:        Behavior: Behavior is withdrawn.        Cognition and Memory: Cognition is impaired. Memory is impaired.     ED Results / Procedures / Treatments   Labs (all labs ordered are listed, but only abnormal results are displayed) Labs Reviewed  COMPREHENSIVE METABOLIC PANEL WITH GFR - Abnormal; Notable for  the following components:      Result Value   Sodium 134 (*)    Glucose, Bld 289 (*)    Creatinine, Ser 1.69 (*)    GFR, Estimated 46 (*)    All other components within normal limits  CBC WITH DIFFERENTIAL/PLATELET - Abnormal; Notable for the following components:   WBC 3.2 (*)    Platelets 127 (*)    All other components within normal limits  ETHANOL  RAPID URINE DRUG SCREEN, HOSP PERFORMED    EKG None  Radiology No results found.  Procedures Procedures    Medications Ordered in ED Medications - No data to display  ED Course/ Medical Decision Making/ A&P                                 Medical Decision Making Adult male with dementia presents with his facility concerns of aggressive behavior in the context of ongoing medication adjustments.  Suspicion for medication adjustments versus cognitive impairment contributing to his episodes given his overt unremarkable initial physical exam.  Patient had labs psych consult given history.  Amount and/or Complexity of Data Reviewed Independent Historian: EMS External Data Reviewed: notes. Labs: ordered. Decision-making details documented in ED Course.  Risk Decision regarding hospitalization. Diagnosis or treatment significantly limited by social determinants of health.   1:26 PM Patient in no distress, he has been seen, evaluated by behavioral health, does not is appropriate for discharge.  Labs reviewed, no evidence for acute compromise, he has remained hemodynamically stable during his ED course, will be returned to his facility.        Final Clinical Impression(s) / ED Diagnoses Final diagnoses:  Agitation due to dementia [facility])    Rx / DC Orders ED Discharge Orders     None         Dorenda Gandy, MD 06/24/23 1326

## 2023-06-24 NOTE — ED Notes (Signed)
 Pt compliant and calm for blood draw. Pt is also continent and requested a urinal for specimen.

## 2023-06-24 NOTE — Discharge Instructions (Signed)
 As discussed, your evaluation today has been largely reassuring.  But, it is important that you monitor your condition carefully, and do not hesitate to return to the ED if you develop new, or concerning changes in your condition. ? ?Otherwise, please follow-up with your physician for appropriate ongoing care. ? ?

## 2023-06-24 NOTE — ED Notes (Signed)
 A/C to send safety sitter, pt wandering, restless, confusion

## 2023-11-13 ENCOUNTER — Ambulatory Visit (INDEPENDENT_AMBULATORY_CARE_PROVIDER_SITE_OTHER): Payer: No Typology Code available for payment source | Admitting: "Endocrinology

## 2023-11-13 ENCOUNTER — Encounter: Payer: Self-pay | Admitting: "Endocrinology

## 2023-11-13 VITALS — BP 88/58 | HR 72 | Ht 70.0 in | Wt 189.0 lb

## 2023-11-13 DIAGNOSIS — I10 Essential (primary) hypertension: Secondary | ICD-10-CM | POA: Diagnosis not present

## 2023-11-13 DIAGNOSIS — K8681 Exocrine pancreatic insufficiency: Secondary | ICD-10-CM | POA: Diagnosis not present

## 2023-11-13 DIAGNOSIS — E1169 Type 2 diabetes mellitus with other specified complication: Secondary | ICD-10-CM | POA: Diagnosis not present

## 2023-11-13 DIAGNOSIS — Z794 Long term (current) use of insulin: Secondary | ICD-10-CM | POA: Diagnosis not present

## 2023-11-13 LAB — POCT GLYCOSYLATED HEMOGLOBIN (HGB A1C): HbA1c, POC (controlled diabetic range): 8.1 % — AB (ref 0.0–7.0)

## 2023-11-13 MED ORDER — LABETALOL HCL 100 MG PO TABS: 100.0000 mg | ORAL_TABLET | Freq: Two times a day (BID) | ORAL | 1 refills | Status: AC

## 2023-11-13 MED ORDER — HUMALOG KWIKPEN 100 UNIT/ML ~~LOC~~ SOPN: 10.0000 [IU] | PEN_INJECTOR | Freq: Three times a day (TID) | SUBCUTANEOUS | 1 refills | Status: AC

## 2023-11-13 MED ORDER — LANTUS SOLOSTAR 100 UNIT/ML ~~LOC~~ SOPN: 20.0000 [IU] | PEN_INJECTOR | Freq: Every day | SUBCUTANEOUS | 1 refills | Status: AC

## 2023-11-13 NOTE — Progress Notes (Signed)
 Endocrinology Consult Note       11/13/2023, 12:30 PM   Subjective:    Patient ID: Curtis Tanner, male    DOB: October 04, 1960.  Curtis Tanner is being seen in consultation for management of currently uncontrolled symptomatic diabetes requested by  Orpha Yancey LABOR, MD.   Past Medical History:  Diagnosis Date   Dementia (HCC)    DM2 (diabetes mellitus, type 2) (HCC)     History reviewed. No pertinent surgical history.  Social History   Socioeconomic History   Marital status: Single    Spouse name: Not on file   Number of children: Not on file   Years of education: Not on file   Highest education level: Not on file  Occupational History   Not on file  Tobacco Use   Smoking status: Never   Smokeless tobacco: Not on file  Substance and Sexual Activity   Alcohol use: Not Currently   Drug use: Not Currently   Sexual activity: Not on file  Other Topics Concern   Not on file  Social History Narrative   Not on file   Social Drivers of Health   Financial Resource Strain: Low Risk  (08/15/2021)   Received from [facility]   Overall Financial Resource Strain (CARDIA)    Difficulty of Paying Living Expenses: Not hard at all  Food Insecurity: Food Insecurity Present (10/24/2022)   Hunger Vital Sign    Worried About Running Out of Food in the Last Year: Often true    Ran Out of Food in the Last Year: Often true  Transportation Needs: No Transportation Needs (10/23/2022)   PRAPARE - Administrator, Civil Service (Medical): No    Lack of Transportation (Non-Medical): No  Physical Activity: Not on file  Stress: Not on file  Social Connections: Not on file    History reviewed. No pertinent family history.  Outpatient Encounter Medications as of 11/13/2023  Medication Sig   mineral oil-hydrophilic petrolatum (AQUAPHOR) ointment Apply topically at bedtime.   [DISCONTINUED]  HUMALOG  KWIKPEN 100 UNIT/ML KwikPen For Glucose 121-150 give 1 unit, 151-200 give 2 units, 201-250 give 3 units, 2 51-300 give 5 units, 301-350 give 7 units, 351-400 give 9 units prior to meals (Patient taking differently: 20 Units 3 (three) times daily with meals. 20 units TIDAC if BG >200)   [DISCONTINUED] LANTUS  SOLOSTAR 100 UNIT/ML Solostar Pen Inject 15 Units into the skin at bedtime. (Patient taking differently: Inject 10 Units into the skin daily.)   Continuous Glucose Receiver (FREESTYLE LIBRE 3 READER) DEVI Use as directed to monitor glucose   Continuous Glucose Sensor (FREESTYLE LIBRE 3 SENSOR) MISC Place 1 sensor on the skin every 14 days. Use to check glucose continuously   CREON 24000-76000 units CPEP Take 72,000 Units by mouth in the morning, at noon, and at bedtime.   divalproex (DEPAKOTE) 125 MG DR tablet Take 125 mg by mouth 2 (two) times daily.   DULoxetine (CYMBALTA) 30 MG capsule Take 30 mg by mouth 2 (two) times daily.   Glucagon  (BAQSIMI  ONE PACK) 3 MG/DOSE POWD Use as directed for hypoglycemia  HUMALOG  KWIKPEN 100 UNIT/ML KwikPen Inject 10-16 Units into the skin 3 (three) times daily before meals.   Insulin  Pen Needle (PEN NEEDLES 3/16) 31G X 5 MM MISC Use as directed with insulin  pen   JARDIANCE 25 MG TABS tablet Take 25 mg by mouth daily.   labetalol  (NORMODYNE ) 100 MG tablet Take 1 tablet (100 mg total) by mouth 2 (two) times daily.   LANTUS  SOLOSTAR 100 UNIT/ML Solostar Pen Inject 20 Units into the skin at bedtime.   memantine (NAMENDA) 10 MG tablet Take 10 mg by mouth 2 (two) times daily.   Omega-3 Fatty Acids (FISH OIL) 1000 MG CAPS Take 1 capsule by mouth 3 (three) times daily.   QUEtiapine (SEROQUEL) 50 MG tablet Take 50 mg by mouth at bedtime.   [DISCONTINUED] glipiZIDE (GLUCOTROL) 5 MG tablet Take 5 mg by mouth 2 (two) times daily.   [DISCONTINUED] labetalol  (NORMODYNE ) 200 MG tablet Take 200 mg by mouth 2 (two) times daily.   No facility-administered encounter  medications on file as of 11/13/2023.    ALLERGIES: Allergies  Allergen Reactions   Ibuprofen Swelling   Latex Swelling   Bee Venom    Heparin (Bovine) Other (See Comments)    Other reaction(s): Other (See Comments)  HIT  HIT    Other reaction(s): Other (See Comments) HIT    VACCINATION STATUS: Immunization History  Administered Date(s) Administered   Moderna Sars-Covid-2 Vaccination 06/24/2019, 07/22/2019    Diabetes He presents for his initial diabetic visit. Diabetes type: Pancreatic diabetes. His disease course has been fluctuating. There are no hypoglycemic associated symptoms. Pertinent negatives for hypoglycemia include no confusion, headaches, pallor or seizures. Associated symptoms include polydipsia and polyuria. Pertinent negatives for diabetes include no chest pain, no fatigue, no polyphagia and no weakness. (Patient is here accompanied by 2 staff members from assisted living.  He is not an optimal historian.  He has dementia likely related to previous alcohol abuse which is also the cause of his pancreatic diabetes.) There are no hypoglycemic complications. Symptoms are stable. (Dementia, anxiety, mood disorders, exocrine pancreatic insufficiency, assisted-living facility resident.) Risk factors for coronary artery disease include diabetes mellitus, male sex and sedentary lifestyle. Current diabetic treatments: Patient is on Lantus  10 units nightly, Humalog  20 units 3 times daily AC, Jardiance 25 mg p.o. daily, glipizide 5 mg p.o. daily. His weight is fluctuating minimally. He is following a generally unhealthy diet. He has not had a previous visit with a dietitian. Exercise: Patient walks in ambulatory on a regular basis. His overall blood glucose range is >200 mg/dl. (Facility did not send his blood glucose readings, however his A1c was found to be 8.1% at point-of-care.  There is no reported hypoglycemia.  Patient has good appetite.)     Review of Systems   Constitutional:  Negative for chills, fatigue, fever and unexpected weight change.  HENT:  Negative for dental problem, mouth sores and trouble swallowing.   Eyes:  Negative for visual disturbance.  Respiratory:  Negative for cough, choking, chest tightness, shortness of breath and wheezing.   Cardiovascular:  Negative for chest pain, palpitations and leg swelling.  Gastrointestinal:  Negative for abdominal distention, abdominal pain, constipation, diarrhea, nausea and vomiting.  Endocrine: Positive for polydipsia and polyuria. Negative for polyphagia.  Genitourinary:  Negative for dysuria, flank pain, hematuria and urgency.  Musculoskeletal:  Negative for back pain, gait problem, myalgias and neck pain.  Skin:  Negative for pallor, rash and wound.  Neurological:  Negative for seizures, syncope, weakness,  numbness and headaches.  Psychiatric/Behavioral:  Negative for confusion and dysphoric mood.        Major mood disorders, anxiety,  dementia.    Objective:       11/13/2023   10:06 AM 06/24/2023   10:11 AM 10/24/2022    4:42 PM  Vitals with BMI  Height 5' 10    Weight 189 lbs    BMI 27.12    Systolic 88 145 185  Diastolic 58 78 94  Pulse 72 67 63    BP (!) 88/58   Pulse 72   Ht 5' 10 (1.778 m)   Wt 189 lb (85.7 kg)   BMI 27.12 kg/m   Wt Readings from Last 3 Encounters:  11/13/23 189 lb (85.7 kg)  10/24/22 173 lb 6.4 oz (78.7 kg)  01/01/21 173 lb 15.1 oz (78.9 kg)     Physical Exam Constitutional:      General: He is not in acute distress.    Appearance: He is well-developed.  HENT:     Head: Normocephalic and atraumatic.  Neck:     Thyroid: No thyromegaly.     Trachea: No tracheal deviation.     Comments: Old trach scar or lower anterior neck Cardiovascular:     Rate and Rhythm: Normal rate.     Pulses:          Dorsalis pedis pulses are 1+ on the right side and 1+ on the left side.       Posterior tibial pulses are 1+ on the right side and 1+ on the left  side.     Heart sounds: Normal heart sounds, S1 normal and S2 normal. No murmur heard.    No gallop.  Pulmonary:     Effort: No respiratory distress.     Breath sounds: Normal breath sounds. No wheezing.  Abdominal:     General: Bowel sounds are normal. There is no distension.     Palpations: Abdomen is soft.     Tenderness: There is no abdominal tenderness. There is no guarding.  Musculoskeletal:     Right shoulder: No swelling or deformity.     Cervical back: Normal range of motion and neck supple.  Skin:    General: Skin is warm and dry.     Findings: No rash.     Nails: There is no clubbing.  Neurological:     Mental Status: He is alert and oriented to person, place, and time.     Cranial Nerves: No cranial nerve deficit.     Sensory: No sensory deficit.     Gait: Gait normal.     Deep Tendon Reflexes: Reflexes are normal and symmetric.  Psychiatric:        Speech: Speech normal.        Behavior: Behavior normal. Behavior is cooperative.        Thought Content: Thought content normal.        Judgment: Judgment normal.       CMP ( most recent) CMP     Component Value Date/Time   NA 134 (L) 06/24/2023 1030   K 4.4 06/24/2023 1030   CL 98 06/24/2023 1030   CO2 25 06/24/2023 1030   GLUCOSE 289 (H) 06/24/2023 1030   BUN 22 06/24/2023 1030   CREATININE 1.69 (H) 06/24/2023 1030   CALCIUM 9.3 06/24/2023 1030   PROT 7.1 06/24/2023 1030   ALBUMIN 3.6 06/24/2023 1030   AST 33 06/24/2023 1030   ALT 27 06/24/2023 1030  ALKPHOS 46 06/24/2023 1030   BILITOT 0.7 06/24/2023 1030   GFRNONAA 46 (L) 06/24/2023 1030     Diabetic Labs (most recent): Lab Results  Component Value Date   HGBA1C 8.1 (A) 11/13/2023   HGBA1C 10.4 (H) 10/22/2022     Lab Results  Component Value Date   TSH 1.515 10/22/2022   TSH 1.332 12/30/2020   FREET4 0.68 10/22/2022      Assessment & Plan:   1. Type 2 diabetes mellitus with other specified complication, with long-term current use  of insulin  (HCC) (Primary)  - Jedrek Larenzo Caples has currently uncontrolled symptomatic pancreatic diabetes.  Staff from assisted living are not sure how long this patient had diabetes for.  He has been at this facility for a year.    Facility did not send his blood glucose readings, however his A1c was found to be 8.1% at point-of-care.  There is no reported hypoglycemia.  Patient has good appetite.  Recent labs reviewed.  -his diabetes is complicated by exocrine pancreatic insufficiency, mood disorder/dementia and he remains at a high risk for more acute and chronic complications which include CAD, CVA, CKD, retinopathy, and neuropathy. These are all discussed in detail with him.  - I have suggested for the patient to avoid excessively processed carbohydrates and encouraged more consumption of  unprocessed or minimally processed  complex starch, adequate protein intake (mainly plant source), minimal liquid fat, plenty of fruits, and vegetables.  - Major priority would be to avoid hypoglycemia in this patient. - In light of his presentation with A1c of 8.1%, I advised to lower his Humalog  to 10-16 units 3 times daily AC for Premeal blood glucose readings above 90 mg per DL. -Every effort should be towards simplification of his treatment, patient has good chance of coming off of the prandial insulin . -I have increased his Lantus  to 20 units nightly. --Advised to discontinue glipizide. -He will continue to benefit from Jardiance 25 mg p.o. daily at breakfast. Facility staff are encouraged to call clinic for blood glucose levels less than 70 or above 200 mg /dl.  - Specific targets for  A1c;  LDL, HDL,  and Triglycerides were discussed with the patient.  2) Blood Pressure /Hypertension:  his blood pressure is tightly controlled controlled with risk of hypotension.  I discussed and lowered his labetalol  to 100 mg p.o. twice daily from 200 mg p.o. twice daily.   3) exocrine pancreatic  insufficiency:  This is likely related to his remote history of alcohol abuse.  He will continue to benefit from Creon 72,000 units daily with meals. 4) Lipids/Hyperlipidemia:   No recent lipid panel today.  He is not on antilipid medications.  He will be considered for subsequent fasting lipid panel.     5)  Weight/Diet:  Body mass index is 27.12 kg/m.  -     he is not a candidate for weight loss.  6) Chronic Care/Health Maintenance: I have encouraged to initiate and continue to follow up with Ophthalmology, Dentist,  Podiatrist at least yearly or according to recommendations, and advised to   stay away from smoking. I have recommended yearly flu vaccine and pneumonia vaccine at least every 5 years; moderate intensity exercise for up to 150 minutes weekly; and  sleep for 7- 9 hours a day.  - he is  advised to maintain close follow up with Hasanaj, Yancey LABOR, MD for primary care needs, as well as his other providers for optimal and coordinated care.   Thank  you for involving me in the care of this pleasant patient.  I spent  62  minutes in the care of the patient today including review of labs from CMP, Lipids, Thyroid Function, Hematology (current and previous including abstractions from other facilities); face-to-face time discussing  his blood glucose readings/logs, discussing hypoglycemia and hyperglycemia episodes and symptoms, medications doses, his options of short and long term treatment based on the latest standards of care / guidelines;  discussion about incorporating lifestyle medicine;  and documenting the encounter. Risk reduction counseling performed per USPSTF guidelines to reduce  cardiovascular risk factors.      Please refer to Patient Instructions for Blood Glucose Monitoring and Insulin /Medications Dosing Guide  in media tab for additional information. Please  also refer to  Patient Self Inventory in the Media  tab for reviewed elements of pertinent patient history.  Matei  Marinda Mae participated in the discussions, expressed understanding, and voiced agreement with the above plans.  All questions were answered to his satisfaction. he is encouraged to contact clinic should he have any questions or concerns prior to his return visit.   Follow up plan: - Return in about 3 months (around 02/12/2024) for F/U with Pre-visit Labs, Meter/CGM/Logs, A1c here.  Ranny Earl, MD [facility] [address] [place], [place] [address] Phone: [phone]  Fax: [phone]    11/13/2023, 12:30 PM  This note was partially dictated with voice recognition software. Similar sounding words can be transcribed inadequately or may not  be corrected upon review.

## 2023-11-13 NOTE — Patient Instructions (Signed)

## 2024-02-14 LAB — COMPREHENSIVE METABOLIC PANEL WITH GFR
ALT: 21 IU/L (ref 0–44)
AST: 28 IU/L (ref 0–40)
Albumin: 3.6 g/dL — ABNORMAL LOW (ref 3.9–4.9)
Alkaline Phosphatase: 44 IU/L — ABNORMAL LOW (ref 47–123)
BUN/Creatinine Ratio: 18 (ref 10–24)
BUN: 30 mg/dL — ABNORMAL HIGH (ref 8–27)
Bilirubin Total: <0.2 mg/dL (ref 0.0–1.2)
CO2: 21 mmol/L (ref 20–29)
Calcium: 9.2 mg/dL (ref 8.6–10.2)
Chloride: 103 mmol/L (ref 96–106)
Creatinine, Ser: 1.66 mg/dL — ABNORMAL HIGH (ref 0.76–1.27)
Globulin, Total: 2.3 g/dL (ref 1.5–4.5)
Glucose: 187 mg/dL — ABNORMAL HIGH (ref 70–99)
Potassium: 4.5 mmol/L (ref 3.5–5.2)
Sodium: 139 mmol/L (ref 134–144)
Total Protein: 5.9 g/dL — ABNORMAL LOW (ref 6.0–8.5)
eGFR: 47 mL/min/1.73 — ABNORMAL LOW

## 2024-02-14 LAB — SPECIMEN STATUS REPORT

## 2024-02-14 LAB — LIPID PANEL
Chol/HDL Ratio: 3.5 ratio (ref 0.0–5.0)
Cholesterol, Total: 124 mg/dL (ref 100–199)
HDL: 35 mg/dL — ABNORMAL LOW
LDL Chol Calc (NIH): 45 mg/dL (ref 0–99)
Triglycerides: 283 mg/dL — ABNORMAL HIGH (ref 0–149)
VLDL Cholesterol Cal: 44 mg/dL — ABNORMAL HIGH (ref 5–40)

## 2024-02-14 LAB — TSH: TSH: 2.25 u[IU]/mL (ref 0.450–4.500)

## 2024-02-14 LAB — T4, FREE: Free T4: 0.76 ng/dL — ABNORMAL LOW (ref 0.82–1.77)

## 2024-02-26 ENCOUNTER — Encounter: Payer: Self-pay | Admitting: "Endocrinology

## 2024-02-26 ENCOUNTER — Ambulatory Visit (INDEPENDENT_AMBULATORY_CARE_PROVIDER_SITE_OTHER): Admitting: "Endocrinology

## 2024-02-26 VITALS — BP 141/74 | HR 57 | Resp 18 | Ht 70.0 in | Wt 183.4 lb

## 2024-02-26 DIAGNOSIS — I10 Essential (primary) hypertension: Secondary | ICD-10-CM

## 2024-02-26 DIAGNOSIS — K8681 Exocrine pancreatic insufficiency: Secondary | ICD-10-CM | POA: Diagnosis not present

## 2024-02-26 DIAGNOSIS — E1169 Type 2 diabetes mellitus with other specified complication: Secondary | ICD-10-CM | POA: Diagnosis not present

## 2024-02-26 DIAGNOSIS — Z794 Long term (current) use of insulin: Secondary | ICD-10-CM | POA: Diagnosis not present

## 2024-02-26 DIAGNOSIS — E039 Hypothyroidism, unspecified: Secondary | ICD-10-CM | POA: Diagnosis not present

## 2024-02-26 LAB — POCT GLYCOSYLATED HEMOGLOBIN (HGB A1C): Hemoglobin A1C: 7.8 % — AB (ref 4.0–5.6)

## 2024-02-26 MED ORDER — LEVOTHYROXINE SODIUM 25 MCG PO TABS: 25.0000 ug | ORAL_TABLET | Freq: Every day | ORAL | 1 refills | Status: AC

## 2024-02-26 NOTE — Progress Notes (Signed)
 "                                                                                 Endocrinology Consult Note       02/26/2024, 5:00 PM   Subjective:    Patient ID: Curtis Tanner, male    DOB: 1960/03/08.  Curtis Tanner is being seen in consultation for management of currently uncontrolled symptomatic diabetes requested by  Orpha Yancey LABOR, MD.   Past Medical History:  Diagnosis Date   Dementia (HCC)    DM2 (diabetes mellitus, type 2) (HCC)     History reviewed. No pertinent surgical history.  Social History   Socioeconomic History   Marital status: Single    Spouse name: Not on file   Number of children: Not on file   Years of education: Not on file   Highest education level: Not on file  Occupational History   Not on file  Tobacco Use   Smoking status: Never   Smokeless tobacco: Not on file  Substance and Sexual Activity   Alcohol use: Not Currently   Drug use: Not Currently   Sexual activity: Not on file  Other Topics Concern   Not on file  Social History Narrative   Not on file   Social Drivers of Health   Tobacco Use: Unknown (02/26/2024)   Patient History    Smoking Tobacco Use: Never    Smokeless Tobacco Use: Unknown    Passive Exposure: Not on file  Financial Resource Strain: Low Risk (08/15/2021)   Received from [facility]   Overall Financial Resource Strain (CARDIA)    Difficulty of Paying Living Expenses: Not hard at all  Food Insecurity: Food Insecurity Present (10/24/2022)   Hunger Vital Sign    Worried About Running Out of Food in the Last Year: Often true    Ran Out of Food in the Last Year: Often true  Transportation Needs: No Transportation Needs (10/23/2022)   PRAPARE - Administrator, Civil Service (Medical): No    Lack of Transportation (Non-Medical): No  Physical Activity: Not on file  Stress: Not on file  Social Connections: Not on file  Depression (EYV7-0): Not on file  Alcohol Screen: Not on file   Housing: Low Risk (10/23/2022)   Housing    Last Housing Risk Score: 0  Utilities: Not At Risk (10/23/2022)   AHC Utilities    Threatened with loss of utilities: No  Health Literacy: Not on file    History reviewed. No pertinent family history.  Outpatient Encounter Medications as of 02/26/2024  Medication Sig   CREON 24000-76000 units CPEP Take 72,000 Units by mouth in the morning, at noon, and at bedtime.   divalproex (DEPAKOTE) 125 MG DR tablet Take 125 mg by mouth 2 (two) times daily.   DULoxetine (CYMBALTA) 30 MG capsule Take 30 mg by mouth 2 (two) times daily.   Glucagon  (BAQSIMI  ONE PACK) 3 MG/DOSE POWD Use as directed for hypoglycemia   HUMALOG  KWIKPEN 100 UNIT/ML KwikPen Inject 10-16 Units into the skin 3 (three) times daily before meals.   Insulin  Pen Needle (PEN NEEDLES 3/16) 31G X 5 MM MISC  Use as directed with insulin  pen   JARDIANCE 25 MG TABS tablet Take 25 mg by mouth daily.   labetalol  (NORMODYNE ) 100 MG tablet Take 1 tablet (100 mg total) by mouth 2 (two) times daily.   LANTUS  SOLOSTAR 100 UNIT/ML Solostar Pen Inject 20 Units into the skin at bedtime.   levothyroxine (SYNTHROID) 25 MCG tablet Take 1 tablet (25 mcg total) by mouth daily before breakfast.   memantine (NAMENDA) 10 MG tablet Take 10 mg by mouth 2 (two) times daily.   mineral oil-hydrophilic petrolatum (AQUAPHOR) ointment Apply topically at bedtime.   Omega-3 Fatty Acids (FISH OIL) 1000 MG CAPS Take 1 capsule by mouth 3 (three) times daily.   QUEtiapine (SEROQUEL) 50 MG tablet Take 50 mg by mouth at bedtime.   No facility-administered encounter medications on file as of 02/26/2024.    ALLERGIES: Allergies  Allergen Reactions   Ibuprofen Swelling   Latex Swelling   Bee Venom    Heparin (Bovine) Other (See Comments)    Other reaction(s): Other (See Comments)  HIT  HIT    Other reaction(s): Other (See Comments) HIT    VACCINATION STATUS: Immunization History  Administered Date(s)  Administered   Moderna Sars-Covid-2 Vaccination 06/24/2019, 07/22/2019    Diabetes He presents for his follow-up diabetic visit. Diabetes type: Pancreatic diabetes. His disease course has been improving. There are no hypoglycemic associated symptoms. Pertinent negatives for hypoglycemia include no confusion, headaches, pallor or seizures. Associated symptoms include polydipsia and polyuria. Pertinent negatives for diabetes include no chest pain, no fatigue, no polyphagia and no weakness. (Patient is here accompanied by 2 staff members from assisted living.  He is not an optimal historian.  He has dementia likely related to previous alcohol abuse which is also the cause of his pancreatic diabetes.) There are no hypoglycemic complications. Symptoms are improving. (Dementia, anxiety, mood disorders, exocrine pancreatic insufficiency, assisted-living facility resident.) Risk factors for coronary artery disease include diabetes mellitus, male sex and sedentary lifestyle. Current diabetic treatments: Patient is on Lantus  10 units nightly, Humalog  20 units 3 times daily AC, Jardiance 25 mg p.o. daily, glipizide 5 mg p.o. daily. His weight is fluctuating minimally. He is following a generally unhealthy diet. He has not had a previous visit with a dietitian. Exercise: Patient walks in ambulatory on a regular basis. His breakfast blood glucose range is generally 140-180 mg/dl. His dinner blood glucose range is generally 140-180 mg/dl. His bedtime blood glucose range is generally 140-180 mg/dl. His overall blood glucose range is 140-180 mg/dl. (He is accompanied by his aide from assisted living facility with his glycemic logs showing improved glycemic profile compared to last presentation.  His point-of-care A1c is 7.8%.  He has no hypoglycemia.  He has good appetite. )       Objective:       02/26/2024   10:03 AM 11/13/2023   10:06 AM 06/24/2023   10:11 AM  Vitals with BMI  Height 5' 10 5' 10   Weight 183  lbs 6 oz 189 lbs   BMI 26.32 27.12   Systolic 141 88 145  Diastolic 74 58 78  Pulse 57 72 67    BP (!) 141/74   Pulse (!) 57   Resp 18   Ht 5' 10 (1.778 m)   Wt 183 lb 6.4 oz (83.2 kg)   SpO2 100%   BMI 26.32 kg/m   Wt Readings from Last 3 Encounters:  02/26/24 183 lb 6.4 oz (83.2 kg)  11/13/23 189 lb (  85.7 kg)  10/24/22 173 lb 6.4 oz (78.7 kg)      CMP ( most recent) CMP     Component Value Date/Time   NA 139 02/13/2024 0000   K 4.5 02/13/2024 0000   CL 103 02/13/2024 0000   CO2 21 02/13/2024 0000   GLUCOSE 187 (H) 02/13/2024 0000   GLUCOSE 289 (H) 06/24/2023 1030   BUN 30 (H) 02/13/2024 0000   CREATININE 1.66 (H) 02/13/2024 0000   CALCIUM 9.2 02/13/2024 0000   PROT 5.9 (L) 02/13/2024 0000   ALBUMIN 3.6 (L) 02/13/2024 0000   AST 28 02/13/2024 0000   ALT 21 02/13/2024 0000   ALKPHOS 44 (L) 02/13/2024 0000   BILITOT <0.2 02/13/2024 0000   EGFR 47 (L) 02/13/2024 0000   GFRNONAA 46 (L) 06/24/2023 1030     Diabetic Labs (most recent): Lab Results  Component Value Date   HGBA1C 7.8 (A) 02/26/2024   HGBA1C 8.1 (A) 11/13/2023   HGBA1C 10.4 (H) 10/22/2022     Lab Results  Component Value Date   TSH 2.250 02/13/2024   TSH 1.515 10/22/2022   TSH 1.332 12/30/2020   FREET4 0.76 (L) 02/13/2024   FREET4 0.68 10/22/2022      Assessment & Plan:   1. Type 2 diabetes mellitus with other specified complication, with long-term current use of insulin  (HCC) (Primary)  - Lucille Ricky Gallery has currently uncontrolled symptomatic pancreatic diabetes.  Staff from assisted living are not sure how long this patient had diabetes for.  He has been at this facility for a year.    He is accompanied by his aide from assisted living facility with his glycemic logs showing improved glycemic profile compared to last presentation.  His point-of-care A1c is 7.8%.  He has no hypoglycemia.  He has good appetite.   -his diabetes is complicated by exocrine pancreatic insufficiency,  mood disorder/dementia and he remains at a high risk for more acute and chronic complications which include CAD, CVA, CKD, retinopathy, and neuropathy. These are all discussed in detail with him.  - I have suggested for the patient to avoid excessively processed carbohydrates and encouraged more consumption of  unprocessed or minimally processed  complex starch, adequate protein intake (mainly plant source), minimal liquid fat, plenty of fruits, and vegetables.  - Major priority would be to avoid hypoglycemia in this patient. - In light of his presentation with near target glycemic profile and point-of-care A1c was 7.8%, he is advised to continue Lantus  20 units nightly, continue Humalog  10-16 units 3 times daily AC  for Premeal blood glucose readings above 90 mg per DL. -Every effort should be towards simplification of his treatment, patient has good chance of coming off of the prandial insulin .  -He will continue to benefit from Jardiance 25 mg p.o. daily at breakfast. Facility staff are encouraged to call clinic for blood glucose levels less than 70 or above 200 mg /dl.  - Specific targets for  A1c;  LDL, HDL,  and Triglycerides were discussed with the patient.  2) Blood Pressure /Hypertension:   He is blood pressure is not  controlled to target.  I discussed and lowered his labetalol  to 100 mg p.o. twice daily from 200 mg p.o. twice daily.   3) exocrine pancreatic insufficiency:  This is likely related to his remote history of alcohol abuse.  He will continue to benefit from Creon 72,000 units daily with meals.   4) Lipids/Hyperlipidemia:   No recent lipid panel today.  He is  not on antilipid medications.  He will be considered for subsequent fasting lipid panel.     5)  Weight/Diet:  Body mass index is 26.32 kg/m.  -He is not a candidate for weight loss.   6) Chronic Care/Health Maintenance: I have encouraged to initiate and continue to follow up with Ophthalmology, Dentist,   Podiatrist at least yearly or according to recommendations, and advised to   stay away from smoking. I have recommended yearly flu vaccine and pneumonia vaccine at least every 5 years; moderate intensity exercise for up to 150 minutes weekly; and  sleep for 7- 9 hours a day.  - he is  advised to maintain close follow up with Hasanaj, Yancey LABOR, MD for primary care needs, as well as his other providers for optimal and coordinated care.   I spent  41  minutes in the care of the patient today including review of labs from CMP, Lipids, Thyroid Function, Hematology (current and previous including abstractions from other facilities); face-to-face time discussing  his blood glucose readings/logs, discussing hypoglycemia and hyperglycemia episodes and symptoms, medications doses, his options of short and long term treatment based on the latest standards of care / guidelines;  discussion about incorporating lifestyle medicine;  and documenting the encounter. Risk reduction counseling performed per USPSTF guidelines to reduce  cardiovascular risk factors.     Please refer to Patient Instructions for Blood Glucose Monitoring and Insulin /Medications Dosing Guide  in media tab for additional information. Please  also refer to  Patient Self Inventory in the Media  tab for reviewed elements of pertinent patient history.  Victormanuel Marinda Mae participated in the discussions, expressed understanding, and voiced agreement with the above plans.  All questions were answered to his satisfaction. he is encouraged to contact clinic should he have any questions or concerns prior to his return visit.    Follow up plan: - Return in about 6 months (around 08/25/2024) for F/U with Pre-visit Labs, Meter/CGM/Logs, A1c here.  Ranny Earl, MD [facility] [address] [place], [place] [address] Phone: [phone]  Fax: [phone]    02/26/2024, 5:00 PM  This note was  partially dictated with voice recognition software. Similar sounding words can be transcribed inadequately or may not  be corrected upon review.  "

## 2024-08-25 ENCOUNTER — Ambulatory Visit: Admitting: "Endocrinology
